# Patient Record
Sex: Male | Born: 1970
Health system: Southern US, Community
[De-identification: ages and names within clinical notes are randomized; demographics above are authoritative.]

## PROBLEM LIST (undated history)

## (undated) DIAGNOSIS — B0089 Other herpesviral infection: Secondary | ICD-10-CM

## (undated) DIAGNOSIS — R7303 Prediabetes: Secondary | ICD-10-CM

## (undated) DIAGNOSIS — L570 Actinic keratosis: Secondary | ICD-10-CM

## (undated) DIAGNOSIS — C44519 Basal cell carcinoma of skin of other part of trunk: Secondary | ICD-10-CM

## (undated) DIAGNOSIS — K219 Gastro-esophageal reflux disease without esophagitis: Secondary | ICD-10-CM

## (undated) DIAGNOSIS — N529 Male erectile dysfunction, unspecified: Secondary | ICD-10-CM

## (undated) DIAGNOSIS — E782 Mixed hyperlipidemia: Secondary | ICD-10-CM

## (undated) DIAGNOSIS — K429 Umbilical hernia without obstruction or gangrene: Secondary | ICD-10-CM

## (undated) DIAGNOSIS — K4090 Unilateral inguinal hernia, without obstruction or gangrene, not specified as recurrent: Secondary | ICD-10-CM

## (undated) DIAGNOSIS — E785 Hyperlipidemia, unspecified: Secondary | ICD-10-CM

## (undated) DIAGNOSIS — L659 Nonscarring hair loss, unspecified: Secondary | ICD-10-CM

## (undated) HISTORY — DX: Mixed hyperlipidemia: E78.2

## (undated) HISTORY — DX: Basal cell carcinoma of skin of other part of trunk: C44.519

## (undated) HISTORY — DX: Actinic keratosis: L57.0

## (undated) HISTORY — DX: Umbilical hernia without obstruction or gangrene: K42.9

## (undated) HISTORY — DX: Other herpesviral infection: B00.89

## (undated) HISTORY — DX: Hyperlipidemia, unspecified: E78.5

---

## 1997-04-29 HISTORY — PX: OTHER SURGICAL HISTORY: SHX169

## 2002-01-24 ENCOUNTER — Encounter: Payer: Self-pay | Admitting: Orthopedic Surgery

## 2002-01-24 ENCOUNTER — Ambulatory Visit (HOSPITAL_COMMUNITY): Admission: RE | Admit: 2002-01-24 | Discharge: 2002-01-24 | Payer: Self-pay | Admitting: Orthopedic Surgery

## 2002-01-31 ENCOUNTER — Encounter: Payer: Self-pay | Admitting: Orthopedic Surgery

## 2002-01-31 ENCOUNTER — Ambulatory Visit (HOSPITAL_COMMUNITY): Admission: RE | Admit: 2002-01-31 | Discharge: 2002-01-31 | Payer: Self-pay | Admitting: Orthopedic Surgery

## 2004-03-15 ENCOUNTER — Encounter: Admission: RE | Admit: 2004-03-15 | Discharge: 2004-03-15 | Payer: Self-pay | Admitting: Orthopedic Surgery

## 2005-04-25 ENCOUNTER — Ambulatory Visit: Payer: Self-pay | Admitting: Family Medicine

## 2005-04-25 LAB — CONVERTED CEMR LAB
Blood Glucose, Fasting: 91 mg/dL
TSH: 2.4 microintl units/mL

## 2005-05-17 ENCOUNTER — Ambulatory Visit: Payer: Self-pay | Admitting: Family Medicine

## 2007-01-20 ENCOUNTER — Emergency Department: Payer: Self-pay | Admitting: Emergency Medicine

## 2007-02-05 ENCOUNTER — Ambulatory Visit: Payer: Self-pay | Admitting: Unknown Physician Specialty

## 2007-02-27 HISTORY — PX: OTHER SURGICAL HISTORY: SHX169

## 2007-03-13 ENCOUNTER — Ambulatory Visit: Payer: Self-pay | Admitting: Orthopaedic Surgery

## 2008-09-30 ENCOUNTER — Encounter: Payer: Self-pay | Admitting: Family Medicine

## 2008-09-30 DIAGNOSIS — E785 Hyperlipidemia, unspecified: Secondary | ICD-10-CM | POA: Insufficient documentation

## 2009-05-28 ENCOUNTER — Ambulatory Visit: Payer: Self-pay | Admitting: Family Medicine

## 2009-05-28 DIAGNOSIS — C4491 Basal cell carcinoma of skin, unspecified: Secondary | ICD-10-CM | POA: Insufficient documentation

## 2009-05-28 DIAGNOSIS — C44599 Other specified malignant neoplasm of skin of other part of trunk: Secondary | ICD-10-CM

## 2009-05-28 DIAGNOSIS — K429 Umbilical hernia without obstruction or gangrene: Secondary | ICD-10-CM | POA: Insufficient documentation

## 2009-06-01 ENCOUNTER — Ambulatory Visit: Payer: Self-pay | Admitting: Family Medicine

## 2009-06-01 LAB — CONVERTED CEMR LAB
ALT: 27 units/L (ref 0–53)
AST: 23 units/L (ref 0–37)
Albumin: 4.6 g/dL (ref 3.5–5.2)
Alkaline Phosphatase: 69 units/L (ref 39–117)
BUN: 15 mg/dL (ref 6–23)
Basophils Absolute: 0 10*3/uL (ref 0.0–0.1)
Basophils Relative: 0.2 % (ref 0.0–3.0)
Bilirubin, Direct: 0 mg/dL (ref 0.0–0.3)
CO2: 30 meq/L (ref 19–32)
Calcium: 9.7 mg/dL (ref 8.4–10.5)
Chloride: 107 meq/L (ref 96–112)
Cholesterol: 215 mg/dL — ABNORMAL HIGH (ref 0–200)
Creatinine, Ser: 0.9 mg/dL (ref 0.4–1.5)
Direct LDL: 148.6 mg/dL
Eosinophils Absolute: 0.1 10*3/uL (ref 0.0–0.7)
Eosinophils Relative: 1.9 % (ref 0.0–5.0)
GFR calc non Af Amer: 101.45 mL/min (ref >60)
Glucose, Bld: 103 mg/dL — ABNORMAL HIGH (ref 70–99)
HCT: 45.9 % (ref 39.0–52.0)
HDL: 27.4 mg/dL — ABNORMAL LOW (ref >39.00)
Hemoglobin: 16 g/dL (ref 13.0–17.0)
Lymphocytes Relative: 29.8 % (ref 12.0–46.0)
Lymphs Abs: 2.1 10*3/uL (ref 0.7–4.0)
MCHC: 34.9 g/dL (ref 30.0–36.0)
MCV: 88.3 fL (ref 78.0–100.0)
Monocytes Absolute: 0.5 10*3/uL (ref 0.1–1.0)
Monocytes Relative: 6.7 % (ref 3.0–12.0)
Neutro Abs: 4.2 10*3/uL (ref 1.4–7.7)
Neutrophils Relative %: 61.4 % (ref 43.0–77.0)
Platelets: 215 10*3/uL (ref 150.0–400.0)
Potassium: 4.7 meq/L (ref 3.5–5.1)
RBC: 5.2 M/uL (ref 4.22–5.81)
RDW: 12.6 % (ref 11.5–14.6)
Sodium: 141 meq/L (ref 135–145)
TSH: 2.47 microintl units/mL (ref 0.35–5.50)
Total Bilirubin: 0.8 mg/dL (ref 0.3–1.2)
Total CHOL/HDL Ratio: 8
Total Protein: 8.1 g/dL (ref 6.0–8.3)
Triglycerides: 251 mg/dL — ABNORMAL HIGH (ref 0.0–149.0)
VLDL: 50.2 mg/dL — ABNORMAL HIGH (ref 0.0–40.0)
WBC: 6.9 10*3/uL (ref 4.5–10.5)

## 2009-06-04 ENCOUNTER — Ambulatory Visit: Payer: Self-pay | Admitting: Family Medicine

## 2009-06-15 ENCOUNTER — Ambulatory Visit: Payer: Self-pay | Admitting: Family Medicine

## 2009-07-28 ENCOUNTER — Ambulatory Visit: Payer: Self-pay | Admitting: Family Medicine

## 2009-08-10 ENCOUNTER — Ambulatory Visit: Payer: Self-pay | Admitting: Family Medicine

## 2010-01-19 ENCOUNTER — Telehealth: Payer: Self-pay | Admitting: Family Medicine

## 2010-01-20 ENCOUNTER — Ambulatory Visit: Payer: Self-pay | Admitting: Family Medicine

## 2010-04-05 ENCOUNTER — Encounter (INDEPENDENT_AMBULATORY_CARE_PROVIDER_SITE_OTHER): Payer: Self-pay | Admitting: *Deleted

## 2010-09-28 NOTE — Progress Notes (Signed)
Summary: Need pain meds called  in  Phone Note Call from Patient Call back at [phone]   Caller: Patient Call For: Philip Leeks MD Summary of Call: Pt called, says he was treated for left hand and arm pain maybe 3 mths ago.  Pt is experiencing more pain. Would like more pain meds called into [facility] [address], [place]  Pt call back # [phone].Marland KitchenDaine Gip  Jan 19, 2010 2:17 PM  Initial call taken by: Daine Gip,  Jan 19, 2010 2:17 PM  Follow-up for Phone Call        I don't know who treated him, but I don't think it was here. He will need to be seen if we are talking about narcotic meds. Philip Leeks MD  Jan 19, 2010 3:04 PM   Left message on machine for patient to call back. Sydell Axon LPN  Jan 19, 2010 3:14 PM    Additional Follow-up for Phone Call Additional follow up Details #1::        Patient notified as instructed by telephone. Patient stated that he was given Vicodin about a year ago. Patient to schedule an appointment to be seen. Additional Follow-up by: Sydell Axon LPN,  Jan 19, 2010 3:25 PM

## 2010-09-28 NOTE — Assessment & Plan Note (Signed)
Summary: L HAND PAIN/CLE   Vital Signs:  Patient profile:   40 year old male Weight:      202 pounds Temp:     97.8 degrees F oral Pulse rate:   84 / minute Pulse rhythm:   regular BP sitting:   118 / 80  (left arm) Cuff size:   regular  Vitals Entered By: Sydell Axon LPN (Jan 20, 2010 9:15 AM) CC: Left arm and hand pain, check back where lesion was removed   History of Present Illness: Pt here for left forarm discomfort that typically also involves the ulnar side three fingers and occas up into the distal upper arm on the ulnar side. He can occas see redness and slight swelling. He has had this recurrently for many years and typically lasts two weeks and then goes away. He has just hadd more intense pain this tiome and was out iof Vicodin that I had given him in Dec.  Problems Prior to Update: 1)  Health Maintenance Exam  (ICD-V70.0) 2)  Umbilical Hernia  (ICD-553.1) 3)  Sprain and Strain of [place]  (ICD-844.1) 4)  Carcinoma, Basal Cell, Back  (ICD-173.5) 5)  Open Wound L Ft No Toe Alone w/o Mention Comp  (ICD-892.0) 6)  Hyperlipidemia, With Low Hdl  (ICD-272.4) 7)  Herpetic Whitlow, Left Ring Finger  (ICD-054.6)  Medications Prior to Update: 1)  Multivitamins  Tabs (Multiple Vitamin) .Marland Kitchen.. 1 Daily  Allergies: No Known Drug Allergies  Physical Exam  General:  Well-developed,well-nourished,in no acute distress; alert,appropriate and cooperative throughout examination. Head:  Normocephalic and atraumatic without obvious abnormalities. No apparent alopecia or balding. Sinuses NT. Eyes:  Conjunctiva clear bilaterally.  Ears:  External ear exam shows no significant lesions or deformities.  Otoscopic examination reveals clear canals, tympanic membranes are intact bilaterally without bulging, retraction, inflammation or discharge. Hearing is grossly normal bilaterally. Nose:  External nasal examination shows no deformity or inflammation. Nasal mucosa are pink and moist  without lesions or exudates. Mouth:  Oral mucosa and oropharynx without lesions or exudates.  Teeth in good repair. Extremities:  Left arm, slightly tender at the med epicondyle, no carpal tunnel sxs, slight erythema along the ulnar distribution.  Skin:  Keloid stable at excision site of upper right back.   Impression & Recommendations:  Problem # 1:  PAIN IN SOFT TISSUES OF L ARM, ULNAR DISTR (ICD-729.5) Assessment Deteriorated Start Aleve regularly per instructions. Heat and ice. Vicodin as needed sparinglty. Recheck in 2 weeks.  Problem # 2:  CARCINOMA, BASAL CELL, BACK (ICD-173.5) Assessment: Improved Has healed with Keloid but looks fine. Reassuered.  Complete Medication List: 1)  Multivitamins Tabs (Multiple vitamin) .Marland Kitchen.. 1 daily 2)  Vicodin 5-500 Mg Tabs (Hydrocodone-acetaminophen) .... One tab by mouth at night as needed arm pain.   Patient Instructions: 1)  Take 2 Aleve after brfst, 3 after supper. 2)  Use heat and ice as dir. 3)  Take Vicodin as needed. Prescriptions: VICODIN 5-500 MG TABS (HYDROCODONE-ACETAMINOPHEN) one tab by mouth at night as needed arm pain.  #30 x 0   Entered and Authorized by:   Shaune Leeks MD   Signed by:   Shaune Leeks MD on 01/20/2010   Method used:   Print then Give to Patient   RxID:   [id number]   Current Allergies (reviewed today): No known allergies

## 2010-09-28 NOTE — Letter (Signed)
Summary: Nadara Eaton letter  [place] [facility]  [address] [place], [place] [address]   Phone: [phone]  Fax: [phone]       04/05/2010 MRN: [id number]  [facility] [address] [place], [place]  [address]  Dear Mr. Lyndal [facility] [place], and [place] announce the retirement of Arta Silence, M.D., from full-time practice at the [facility] office effective February 25, 2010 and his plans of returning part-time.  It is important to Dr. Hetty Ely and to our practice that you understand that [facility] has seven physicians in our office for your health care needs.  We will continue to offer the same exceptional care that you have today.    Dr. Hetty Ely has spoken to many of you about his plans for retirement and returning part-time in the fall.   We will continue to work with you through the transition to schedule appointments for you in the office and meet the high standards that [facility] is committed to.   Again, it is with great pleasure that we share the news that Dr. Hetty Ely will return to [facility] at [facility] in October of 2011 with a reduced schedule.    If you have any questions, or would like to request an appointment with one of our physicians, please call us at [phone] and press the option for Scheduling an appointment.  We take pleasure in providing you with excellent patient care and look forward to seeing you at your next office visit.  Our [facility] Phys[facility] are:  Tillman Abide, M.D. Laurita Quint, M.D. Roxy Manns, M.D. Kerby Nora, M.D. Hannah Beat, M.D. Ruthe Mannan, M.D. We proudly welcomed Raechel Ache, M.D. and Eustaquio Boyden, M.D. to the practice in July/August 2011.  Sincerely,   [facility]

## 2011-05-18 ENCOUNTER — Ambulatory Visit (INDEPENDENT_AMBULATORY_CARE_PROVIDER_SITE_OTHER): Payer: BC Managed Care – PPO | Admitting: Family Medicine

## 2011-05-18 ENCOUNTER — Encounter: Payer: Self-pay | Admitting: Family Medicine

## 2011-05-18 DIAGNOSIS — B029 Zoster without complications: Secondary | ICD-10-CM

## 2011-05-18 DIAGNOSIS — K429 Umbilical hernia without obstruction or gangrene: Secondary | ICD-10-CM

## 2011-05-18 DIAGNOSIS — D1730 Benign lipomatous neoplasm of skin and subcutaneous tissue of unspecified sites: Secondary | ICD-10-CM | POA: Insufficient documentation

## 2011-05-18 DIAGNOSIS — D1739 Benign lipomatous neoplasm of skin and subcutaneous tissue of other sites: Secondary | ICD-10-CM

## 2011-05-18 MED ORDER — HYDROCODONE-ACETAMINOPHEN 5-500 MG PO TABS: 1.0000 | ORAL_TABLET | Freq: Every evening | ORAL | Status: DC | PRN

## 2011-05-18 NOTE — Assessment & Plan Note (Signed)
His lesion between his fingers with same place and same look of outbreak and involvement up the hand and arm with erythema but not overt further vesicular rash, acts like shingles but also like overt herpetic outbreak. Valtrex has not been worthwhile as he tolerates his occas outbreaks with pain medication well and does not want to take chronic medication. Will therefore treat as desired with pain medication when needed. He takes this basically once a day to allow to get to sleep so have no problem prescribing.  Discussed eventually getting Zostavax, which may help lessen frequency, length of intensity of outbreak.

## 2011-05-18 NOTE — Assessment & Plan Note (Signed)
No change by my exam. Cont to follow.

## 2011-05-18 NOTE — Progress Notes (Signed)
  Subjective:    Patient ID: Philip Reynolds, male    DOB: 1971-07-04, 40 y.o.   MRN: [id number]  HPI Pt here as acute appt after not being seen for some time. He gets a vessicular outbreak in coalescent area between the left ring finger medial side mid medial phalanx. He had been on Valtrex daily in the past which helped but he only has symptoms once or twice a year so he stopped the Valtrex as it did not seem justified based on frequency of sxs. He was told once in the past that he had herpetic whitlow. His sxs do seem to be triggered by stress.   He also has slight swollen subcutaneous lesion on the right lower abd. He also again questions whether he needs to be seen for his umbilical hernia. It has not progressed over the years and is still contained within the umbilicus proper.    Review of SystemsNoncontributory except as above.       Objective:   Physical Exam  Constitutional: He appears well-developed and well-nourished. No distress.  HENT:  Head: Normocephalic and atraumatic.  Right Ear: External ear normal.  Left Ear: External ear normal.  Nose: Nose normal.  Mouth/Throat: Oropharynx is clear and moist.  Eyes: Conjunctivae and EOM are normal. Pupils are equal, round, and reactive to light. Right eye exhibits no discharge. Left eye exhibits no discharge.  Neck: Normal range of motion. Neck supple.  Cardiovascular: Normal rate and regular rhythm.   Pulmonary/Chest: Effort normal and breath sounds normal. He has no wheezes.  Abdominal: Soft. Bowel sounds are normal. He exhibits no distension. There is no tenderness. There is no rebound.       Small umbilical hernia totally contained within his umbilicus. No overt defect felt. No tenderness or distention and unchanged from previous exams.  Lymphadenopathy:    He has no cervical adenopathy.  Skin: Skin is warm and dry. No rash noted. He is not diaphoretic. No erythema. No pallor.       1.5cm soft mobile subdermal swelling in the  RLQ of the abdomen, NT and unchanged from previously.          Assessment & Plan:

## 2011-05-18 NOTE — Assessment & Plan Note (Signed)
1.5cm on lower right abdomen. Smaller one on back. Will probably develop more. Reassured and will follow.

## 2013-07-14 ENCOUNTER — Other Ambulatory Visit: Payer: Self-pay | Admitting: Family Medicine

## 2013-07-14 DIAGNOSIS — E785 Hyperlipidemia, unspecified: Secondary | ICD-10-CM

## 2013-07-15 ENCOUNTER — Other Ambulatory Visit: Payer: BC Managed Care – PPO

## 2013-07-16 ENCOUNTER — Other Ambulatory Visit (INDEPENDENT_AMBULATORY_CARE_PROVIDER_SITE_OTHER): Payer: BC Managed Care – PPO

## 2013-07-16 DIAGNOSIS — E785 Hyperlipidemia, unspecified: Secondary | ICD-10-CM

## 2013-07-16 LAB — BASIC METABOLIC PANEL
BUN: 16 mg/dL (ref 6–23)
Calcium: 9.8 mg/dL (ref 8.4–10.5)
Creatinine, Ser: 1.1 mg/dL (ref 0.4–1.5)
GFR: 83.07 mL/min (ref >60.00)
Glucose, Bld: 101 mg/dL — ABNORMAL HIGH (ref 70–99)

## 2013-07-18 ENCOUNTER — Encounter: Payer: Self-pay | Admitting: Family Medicine

## 2013-07-18 ENCOUNTER — Encounter: Payer: Self-pay | Admitting: *Deleted

## 2013-07-18 ENCOUNTER — Ambulatory Visit (INDEPENDENT_AMBULATORY_CARE_PROVIDER_SITE_OTHER): Payer: BC Managed Care – PPO | Admitting: Family Medicine

## 2013-07-18 VITALS — BP 122/86 | HR 64 | Temp 98.0°F | Ht 72.0 in | Wt 199.0 lb

## 2013-07-18 DIAGNOSIS — B029 Zoster without complications: Secondary | ICD-10-CM

## 2013-07-18 DIAGNOSIS — Z23 Encounter for immunization: Secondary | ICD-10-CM

## 2013-07-18 DIAGNOSIS — Z0000 Encounter for general adult medical examination without abnormal findings: Secondary | ICD-10-CM | POA: Insufficient documentation

## 2013-07-18 DIAGNOSIS — K4090 Unilateral inguinal hernia, without obstruction or gangrene, not specified as recurrent: Secondary | ICD-10-CM | POA: Insufficient documentation

## 2013-07-18 DIAGNOSIS — E785 Hyperlipidemia, unspecified: Secondary | ICD-10-CM

## 2013-07-18 DIAGNOSIS — Z0001 Encounter for general adult medical examination with abnormal findings: Secondary | ICD-10-CM | POA: Insufficient documentation

## 2013-07-18 DIAGNOSIS — K429 Umbilical hernia without obstruction or gangrene: Secondary | ICD-10-CM

## 2013-07-18 NOTE — Assessment & Plan Note (Signed)
Elevated again today - reviewed with patient as well as discussed healthy diet changes to help control hyperlipidemia. Provided with low chol diet handout.

## 2013-07-18 NOTE — Progress Notes (Signed)
Subjective:    Patient ID: Philip Reynolds, male    DOB: Jan 25, 1971, 42 y.o.   MRN: [id number]  HPI CC: CPE  Pleasant 42 yo presents today for annual exam.  ?R inguinal hernia.  Noticed worse for last 2 week (started after prolonged period of walking at [employer]).  Worse with prolonged standing/walking - bulges.  Reduces with laying down.  H/o vicodin use for intermittent ?shingles vs herpetic whitlow of left hand.  Has tried valtrex in the past - didn't notice improvement.  Stress or injury related.  Lives with wife Philip Reynolds Occ: [employer] business. Activity: regular gym 5d /wk Diet: tries to eat healthy, avoids fast food, good water, fruits/vegetables daily  Wt Readings from Last 3 Encounters:  07/18/13 199 lb (90.266 kg)  05/18/11 198 lb (89.812 kg)  01/20/10 202 lb (91.627 kg)   Preventative: Flu - today Td 2002.  Tdap today  Medications and allergies reviewed and updated in chart.  Past histories reviewed and updated if relevant as below. Patient Active Problem List   Diagnosis Date Noted  . Shingles outbreak 05/18/2011  . Lipoma of skin and subcutaneous tissue 05/18/2011  . CARCINOMA, BASAL CELL, BACK 05/28/2009  . UMBILICAL HERNIA 05/28/2009  . HYPERLIPIDEMIA, WITH LOW HDL 09/30/2008   Past Medical History  Diagnosis Date  . Basal cell carcinoma of back   . Herpetic whitlow     left ring finger; Dr. Levonne Lapping  . Other and unspecified hyperlipidemia   . Umbilical hernia without mention of obstruction or gangrene    Past Surgical History  Procedure Laterality Date  . Herpetic whitlow  04/1997    Left hand [facility] by Dr. Henderson Newcomer at [facility]  . Anterior cruciate ligament repair (aka acl) and meniscectomy  02/2007    Right knee (Dr. Mack Guise)   History  Substance Use Topics  . Smoking status: Never Smoker   . Smokeless tobacco: Never Used  . Alcohol Use: Yes     Comment: Occasional   Family History  Problem Relation Age of Onset  .  Diabetes Father   . Stroke Father     Post radiation for brain tumor  . Cancer Father     Brain tumor  . Depression Father   . Heart disease Neg Hx   . Kidney disease Neg Hx   . Alcohol abuse Neg Hx   . Drug abuse Neg Hx    No Known Allergies Current Outpatient Prescriptions on File Prior to Visit  Medication Sig Dispense Refill  . Multiple Vitamin (MULTIVITAMIN) tablet Take 1 tablet by mouth daily.         No current facility-administered medications on file prior to visit.    Review of Systems  Constitutional: Negative for fever, chills, activity change, appetite change, fatigue and unexpected weight change.  HENT: Negative for hearing loss.   Eyes: Negative for visual disturbance.  Respiratory: Negative for cough, chest tightness, shortness of breath and wheezing.   Cardiovascular: Negative for chest pain, palpitations and leg swelling.  Gastrointestinal: Negative for nausea, vomiting, abdominal pain, diarrhea, constipation, blood in stool and abdominal distention.  Genitourinary: Negative for hematuria and difficulty urinating.  Musculoskeletal: Negative for arthralgias, myalgias and neck pain.  Skin: Negative for rash.  Neurological: Negative for dizziness, seizures, syncope and headaches.  Hematological: Negative for adenopathy. Does not bruise/bleed easily.  Psychiatric/Behavioral: Negative for dysphoric mood. The patient is not nervous/anxious.        Objective:   Physical Exam  Nursing note  and vitals reviewed. Constitutional: He is oriented to person, place, and time. He appears well-developed and well-nourished. No distress.  HENT:  Head: Normocephalic and atraumatic.  Right Ear: Hearing, tympanic membrane, external ear and ear canal normal.  Left Ear: Hearing, tympanic membrane, external ear and ear canal normal.  Nose: Nose normal.  Mouth/Throat: Oropharynx is clear and moist. No oropharyngeal exudate.  Eyes: Conjunctivae and EOM are normal. Pupils are equal,  round, and reactive to light. No scleral icterus.  Neck: Normal range of motion. Neck supple.  Cardiovascular: Normal rate, regular rhythm, normal heart sounds and intact distal pulses.   No murmur heard. Pulses:      Radial pulses are 2+ on the right side, and 2+ on the left side.  Pulmonary/Chest: Effort normal and breath sounds normal. No respiratory distress. He has no wheezes. He has no rales.  Abdominal: Soft. Bowel sounds are normal. He exhibits no distension and no mass. There is no tenderness. There is no rebound and no guarding. Hernia confirmed negative in the right inguinal area and confirmed negative in the left inguinal area.  Genitourinary:  No hernia appreciated today.  Musculoskeletal: Normal range of motion. He exhibits no edema.  Lymphadenopathy:    He has no cervical adenopathy.       Right: No inguinal adenopathy present.       Left: No inguinal adenopathy present.  Neurological: He is alert and oriented to person, place, and time.  CN grossly intact, station and gait intact  Skin: Skin is warm and dry. No rash noted.  Psychiatric: He has a normal mood and affect. His behavior is normal. Judgment and thought content normal.      Assessment & Plan:

## 2013-07-18 NOTE — Assessment & Plan Note (Signed)
minimal

## 2013-07-18 NOTE — Patient Instructions (Signed)
Flu shot today. Tdap today (tetanus and pertussis). Low chol handout provided today. Pass by [facility]'s office for referral to surgeon to check R hernia. Good to see you today, call us with questions.

## 2013-07-18 NOTE — Assessment & Plan Note (Signed)
Advised return when next flare for eval, but I would prescribe pain med in interim as needed.

## 2013-07-18 NOTE — Assessment & Plan Note (Signed)
Story consistent with R inguinal hernia although not appreciated on exam today (he has been sedentary last 2 days) Will refer to surgery for eval (pt requests Dr. Birdie Sons).

## 2013-07-18 NOTE — Progress Notes (Signed)
Pre-visit discussion using our clinic review tool. No additional management support is needed unless otherwise documented below in the visit note.  

## 2013-07-18 NOTE — Addendum Note (Signed)
Addended by: Josph Macho A on: 07/18/2013 12:43 PM   Modules accepted: Orders

## 2013-07-18 NOTE — Assessment & Plan Note (Signed)
Preventative protocols reviewed and updated unless pt declined. Discussed healthy diet and lifestyle.  

## 2013-07-30 ENCOUNTER — Encounter: Payer: Self-pay | Admitting: General Surgery

## 2013-07-30 ENCOUNTER — Ambulatory Visit (INDEPENDENT_AMBULATORY_CARE_PROVIDER_SITE_OTHER): Payer: BC Managed Care – PPO | Admitting: General Surgery

## 2013-07-30 VITALS — BP 122/80 | HR 76 | Resp 14 | Ht 72.0 in | Wt 201.0 lb

## 2013-07-30 DIAGNOSIS — K4090 Unilateral inguinal hernia, without obstruction or gangrene, not specified as recurrent: Secondary | ICD-10-CM

## 2013-07-30 DIAGNOSIS — K429 Umbilical hernia without obstruction or gangrene: Secondary | ICD-10-CM

## 2013-07-30 NOTE — Patient Instructions (Addendum)
Patient to be scheduled for hernia repair.   Hernia, Surgical Repair A hernia occurs when an internal organ pushes out through a weak spot in the belly (abdominal) wall muscles. Hernias commonly occur in the groin and around the navel. Hernias often can be pushed back into place (reduced). Most hernias tend to get worse over time. Problems occur when abdominal contents get stuck in the opening (incarcerated hernia). The blood supply gets cut off (strangulated hernia). This is an emergency and needs surgery. Otherwise, hernia repair can be an elective procedure. This means you can schedule this at your convenience when an emergency is not present. Because complications can occur, if you decide to repair the hernia, it is best to do it soon. When it becomes an emergency procedure, there is increased risk of complications after surgery. CAUSES   Heavy lifting.  Obesity.  Prolonged coughing.  Straining to move your bowels.  Hernias can also occur through a cut (incision) by a surgeonafter an abdominal operation. HOME CARE INSTRUCTIONS Before the repair:  Bed rest is not required. You may continue your normal activities, but avoid heavy lifting (more than 10 pounds) or straining. Cough gently. If you are a smoker, it is best to stop. Even the best hernia repair can break down with the continual strain of coughing.  Do not wear anything tight over your hernia. Do not try to keep it in with an outside bandage or truss. These can damage abdominal contents if they are trapped in the hernia sac.  Eat a normal diet. Avoid constipation. Straining over long periods of time to have a bowel movement will increase hernia size. It also can breakdown repairs. If you cannot do this with diet alone, laxatives or stool softeners may be used. PRIOR TO SURGERY, SEEK IMMEDIATE MEDICAL CARE IF: You have problems (symptoms) of a trapped (incarcerated) hernia. Symptoms include:  An oral temperature above 102 F  (38.9 C) develops, or as your caregiver suggests.  Increasing abdominal pain.  Feeling sick to your stomach(nausea) and vomiting.  You stop passing gas or stool.  The hernia is stuck outside the abdomen, looks discolored, feels hard, or is tender.  You have any changes in your bowel habits or in the hernia that is unusual for you. LET YOUR CAREGIVERS KNOW ABOUT THE FOLLOWING:  Allergies.  Medications taken including herbs, eye drops, over the counter medications, and creams.  Use of steroids (by mouth or creams).  Family or personal history of problems with anesthetics or Novocaine.  Possibility of pregnancy, if this applies.  Personal history of blood clots (thrombophlebitis).  Family or personal history of bleeding or blood problems.  Previous surgery.  Other health problems. BEFORE THE PROCEDURE You should be present 1 hour prior to your procedure, or as directed by your caregiver.  AFTER THE PROCEDURE After surgery, you will be taken to the recovery area. A nurse will watch and check your progress there. Once you are awake, stable, and taking fluids well, you will be allowed to go home as long as there are no problems. Once home, an ice pack (wrapped in a light towel) applied to your operative site may help with discomfort. It may also keep the swelling down. Do not lift anything heavier than 10 pounds (4.55 kilograms). Take showers not baths. Do not drive while taking narcotics. Follow instructions as suggested by your caregiver.  SEEK IMMEDIATE MEDICAL CARE IF: After surgery:  There is redness, swelling, or increasing pain in the wound.  There  is pus coming from the wound.  There is drainage from a wound lasting longer than 1 day.  An unexplained oral temperature above 102 F (38.9 C) develops.  You notice a foul smell coming from the wound or dressing.  There is a breaking open of a wound (edged not staying together) after the sutures have been removed.  You  notice increasing pain in the shoulders (shoulder strap areas).  You develop dizzy episodes or fainting while standing.  You develop persistent nausea or vomiting.  You develop a rash.  You have difficulty breathing.  You develop any reaction or side effects to medications given. MAKE SURE YOU:   Understand these instructions.  Will watch your condition.  Will get help right away if you are not doing well or get worse. Document Released: 02/08/2001 Document Revised: 11/07/2011 Document Reviewed: 01/01/2008 [facility] Patient Information 2014 [place], [place].  Patient's surgery has been scheduled for 08-06-13 at [facility].

## 2013-07-30 NOTE — Progress Notes (Signed)
Patient ID: Philip Reynolds, male   DOB: April 18, 1971, 42 y.o.   MRN: [id number]  Chief Complaint  Patient presents with  . Other    New patient evaluation of right inguinal hernia    HPI Philip Reynolds is a 42 y.o. male.  Patient here today for an evaluation of a hernia in the right groin.  States that he noticed it for about 1  month.  It does seem to be causing some  pain that comes and goes. He notices it more when he's on his feet a lot. He is able to push the hernia back in. No nausea, vomiting, constipation or diarrhea noted.  HPI  Past Medical History  Diagnosis Date  . Basal cell carcinoma of back   . Herpetic whitlow     left ring finger; Dr. Levonne Lapping  . Other and unspecified hyperlipidemia   . Umbilical hernia without mention of obstruction or gangrene     Past Surgical History  Procedure Laterality Date  . Herpetic whitlow  04/1997    Left hand followed by Dr. Henderson Newcomer at [facility]  . Anterior cruciate ligament repair (aka acl) and meniscectomy  02/2007    Right knee (Dr. Mack Guise)    Family History  Problem Relation Age of Onset  . Diabetes Father   . Stroke Father     Post radiation for brain tumor  . Cancer Father     Brain tumor  . Depression Father   . Kidney disease Neg Hx   . Alcohol abuse Neg Hx   . Drug abuse Neg Hx   . CAD Neg Hx     Social History History  Substance Use Topics  . Smoking status: Never Smoker   . Smokeless tobacco: Never Used  . Alcohol Use: Yes     Comment: Occasional    No Known Allergies  Current Outpatient Prescriptions  Medication Sig Dispense Refill  . Multiple Vitamin (MULTIVITAMIN) tablet Take 1 tablet by mouth daily.         No current facility-administered medications for this visit.    Review of Systems Review of Systems  Constitutional: Negative.   Respiratory: Negative.   Cardiovascular: Negative.   Gastrointestinal: Positive for abdominal pain.    Blood pressure 122/80, pulse 76, resp. rate 14, height  6' (1.829 m), weight 201 lb (91.173 kg).  Physical Exam Physical Exam  Constitutional: He is oriented to person, place, and time. He appears well-developed and well-nourished.  Eyes: Conjunctivae are normal. No scleral icterus.  Neck: Neck supple. No thyromegaly present.  Cardiovascular: Normal rate, regular rhythm and normal heart sounds.   No murmur heard. Pulmonary/Chest: Effort normal and breath sounds normal.  Abdominal: Soft. Normal appearance and bowel sounds are normal. A hernia is present. Hernia confirmed positive in the right inguinal area (small reducible, mildly tender).  Small reducible umbilical hernia. 2 cm in size. Fascial defect less than 1 cm.   Lymphadenopathy:    He has no cervical adenopathy.  Neurological: He is alert and oriented to person, place, and time.  Skin: Skin is warm and dry.    Data Reviewed None  Assessment    Umbilical and Right Inguinal Hernia present. He has had the umbilical hernia for a long time, has not changed and is not symptomatic.    Plan   Discussed repair of inguinal harnia. Both laparoscopic and open repairs explained. At present he opts to have open repair. Also he is agreeable to MAC. Patient to be scheduled  for right inguinal hernia repair.     This patient's surgery has been scheduled for 08-06-13 at [facility].   Alazne Quant G 07/30/2013, 4:20 PM

## 2013-07-31 ENCOUNTER — Telehealth: Payer: Self-pay | Admitting: *Deleted

## 2013-07-31 NOTE — Telephone Encounter (Signed)
Patient called to report that he would like to have a lap repair of his right inguinal hernia and umbilical hernia at the same time. Please update orders. Patient is currently scheduled for surgery on 08-06-13 at [facility].   [place] has been notified of this change.

## 2013-08-01 NOTE — Addendum Note (Signed)
Addended by: Kieth Brightly on: 08/01/2013 09:56 AM   Modules accepted: Orders

## 2013-08-06 ENCOUNTER — Ambulatory Visit: Payer: Self-pay | Admitting: General Surgery

## 2013-08-06 DIAGNOSIS — K4090 Unilateral inguinal hernia, without obstruction or gangrene, not specified as recurrent: Secondary | ICD-10-CM

## 2013-08-06 DIAGNOSIS — K429 Umbilical hernia without obstruction or gangrene: Secondary | ICD-10-CM

## 2013-08-06 HISTORY — PX: UMBILICAL HERNIA REPAIR: SHX196

## 2013-08-06 HISTORY — PX: HERNIA REPAIR: SHX51

## 2013-08-06 HISTORY — PX: INGUINAL HERNIA REPAIR: SUR1180

## 2013-08-07 ENCOUNTER — Encounter: Payer: Self-pay | Admitting: General Surgery

## 2013-08-19 ENCOUNTER — Encounter: Payer: Self-pay | Admitting: General Surgery

## 2013-08-19 ENCOUNTER — Ambulatory Visit (INDEPENDENT_AMBULATORY_CARE_PROVIDER_SITE_OTHER): Payer: BC Managed Care – PPO | Admitting: General Surgery

## 2013-08-19 VITALS — BP 120/80 | HR 76 | Resp 12 | Ht 72.0 in | Wt 199.0 lb

## 2013-08-19 DIAGNOSIS — K429 Umbilical hernia without obstruction or gangrene: Secondary | ICD-10-CM

## 2013-08-19 DIAGNOSIS — K4090 Unilateral inguinal hernia, without obstruction or gangrene, not specified as recurrent: Secondary | ICD-10-CM

## 2013-08-19 NOTE — Progress Notes (Signed)
This is a 42 year old malel here today for his post op umbilical hernia repair and right inguinal hernia repair done on 08/06/13. Patient states he is doing well.  No recurrent hernia,no sign of infection. Abdomen is soft.incision looks clean and healing well.

## 2013-08-19 NOTE — Patient Instructions (Addendum)
Patient to return in one month. After another 5 days he start increasing activity as tolerated.

## 2013-08-20 ENCOUNTER — Encounter: Payer: Self-pay | Admitting: General Surgery

## 2013-09-24 ENCOUNTER — Ambulatory Visit (INDEPENDENT_AMBULATORY_CARE_PROVIDER_SITE_OTHER): Payer: No Typology Code available for payment source | Admitting: General Surgery

## 2013-09-24 ENCOUNTER — Encounter: Payer: Self-pay | Admitting: General Surgery

## 2013-09-24 VITALS — BP 128/78 | HR 78 | Resp 12 | Ht 72.0 in | Wt 201.0 lb

## 2013-09-24 DIAGNOSIS — K429 Umbilical hernia without obstruction or gangrene: Secondary | ICD-10-CM

## 2013-09-24 DIAGNOSIS — K4090 Unilateral inguinal hernia, without obstruction or gangrene, not specified as recurrent: Secondary | ICD-10-CM

## 2013-09-24 NOTE — Progress Notes (Signed)
Patient ID: Philip Reynolds, male   DOB: [birth year], 43 y.o.   MRN: [id number]   The patient presents for a 1 month post op lap/right inguinal hernia as well as an open umbilical hernia repair. The procedure was performed on 08/06/13. The patient states no complaints at this time.   Well healed port sites. No defects noted. Abd is soft, nontender.

## 2013-09-24 NOTE — Patient Instructions (Signed)
Patient to return as needed. 

## 2013-12-16 ENCOUNTER — Ambulatory Visit (INDEPENDENT_AMBULATORY_CARE_PROVIDER_SITE_OTHER): Payer: No Typology Code available for payment source | Admitting: Internal Medicine

## 2013-12-16 ENCOUNTER — Encounter: Payer: Self-pay | Admitting: Internal Medicine

## 2013-12-16 VITALS — BP 122/76 | HR 70 | Temp 98.2°F | Wt 200.0 lb

## 2013-12-16 DIAGNOSIS — B029 Zoster without complications: Secondary | ICD-10-CM

## 2013-12-16 MED ORDER — HYDROCODONE-ACETAMINOPHEN 5-325 MG PO TABS: 1.0000 | ORAL_TABLET | Freq: Four times a day (QID) | ORAL | Status: DC | PRN

## 2013-12-16 NOTE — Patient Instructions (Addendum)
Shingles Shingles (herpes zoster) is an infection that is caused by the same virus that causes chickenpox (varicella). The infection causes a painful skin rash and fluid-filled blisters, which eventually break open, crust over, and heal. It may occur in any area of the body, but it usually affects only one side of the body or face. The pain of shingles usually lasts about 1 month. However, some people with shingles may develop long-term (chronic) pain in the affected area of the body. Shingles often occurs many years after the person had chickenpox. It is more common:  In people older than 50 years.  In people with weakened immune systems, such as those with HIV, AIDS, or cancer.  In people taking medicines that weaken the immune system, such as transplant medicines.  In people under great stress. CAUSES  Shingles is caused by the varicella zoster virus (VZV), which also causes chickenpox. After a person is infected with the virus, it can remain in the person's body for years in an inactive state (dormant). To cause shingles, the virus reactivates and breaks out as an infection in a nerve root. The virus can be spread from person to person (contagious) through contact with open blisters of the shingles rash. It will only spread to people who have not had chickenpox. When these people are exposed to the virus, they may develop chickenpox. They will not develop shingles. Once the blisters scab over, the person is no longer contagious and cannot spread the virus to others. SYMPTOMS  Shingles shows up in stages. The initial symptoms may be pain, itching, and tingling in an area of the skin. This pain is usually described as burning, stabbing, or throbbing.In a few days or weeks, a painful red rash will appear in the area where the pain, itching, and tingling were felt. The rash is usually on one side of the body in a band or belt-like pattern. Then, the rash usually turns into fluid-filled blisters. They  will scab over and dry up in approximately 2 3 weeks. Flu-like symptoms may also occur with the initial symptoms, the rash, or the blisters. These may include:  Fever.  Chills.  Headache.  Upset stomach. DIAGNOSIS  Your caregiver will perform a skin exam to diagnose shingles. Skin scrapings or fluid samples may also be taken from the blisters. This sample will be examined under a microscope or sent to a lab for further testing. TREATMENT  There is no specific cure for shingles. Your caregiver will likely prescribe medicines to help you manage the pain, recover faster, and avoid long-term problems. This may include antiviral drugs, anti-inflammatory drugs, and pain medicines. HOME CARE INSTRUCTIONS   Take a cool bath or apply cool compresses to the area of the rash or blisters as directed. This may help with the pain and itching.   Only take over-the-counter or prescription medicines as directed by your caregiver.   Rest as directed by your caregiver.  Keep your rash and blisters clean with mild soap and cool water or as directed by your caregiver.  Do not pick your blisters or scratch your rash. Apply an anti-itch cream or numbing creams to the affected area as directed by your caregiver.  Keep your shingles rash covered with a loose bandage (dressing).  Avoid skin contact with:  Babies.   Pregnant women.   Children with eczema.   Elderly people with transplants.   People with chronic illnesses, such as leukemia or AIDS.   Wear loose-fitting clothing to help ease   the pain of material rubbing against the rash.  Keep all follow-up appointments with your caregiver.If the area involved is on your face, you may receive a referral for follow-up to a specialist, such as an eye doctor (ophthalmologist) or an ear, nose, and throat (ENT) doctor. Keeping all follow-up appointments will help you avoid eye complications, chronic pain, or disability.  SEEK IMMEDIATE MEDICAL  CARE IF:   You have facial pain, pain around the eye area, or loss of feeling on one side of your face.  You have ear pain or ringing in your ear.  You have loss of taste.  Your pain is not relieved with prescribed medicines.   Your redness or swelling spreads.   You have more pain and swelling.  Your condition is worsening or has changed.   You have a feveror persistent symptoms for more than 2 3 days.  You have a fever and your symptoms suddenly get worse. MAKE SURE YOU:  Understand these instructions.  Will watch your condition.  Will get help right away if you are not doing well or get worse. Document Released: 08/15/2005 Document Revised: 05/09/2012 Document Reviewed: 03/29/2012 Exit[employer] Patient Information 2014 [employer].  

## 2013-12-16 NOTE — Progress Notes (Signed)
Subjective:    Patient ID: Philip Reynolds, Philip Reynolds    DOB: [birth year], 43 y.o.   MRN: [id number]  HPI  Pt presents to the clinic today with c/o a blister on his left ring finger. He noticed this. There is some redness in the area and the blister has also spread to his left arm. He does feel like it is stress related. Valtrex doesn't help. He has been seen for a similar outbreak in 2012, he was diagnosed with shingles. He has also been told that he has herpetic whitlow in the past.  Review of Systems      Past Medical History  Diagnosis Date  . Basal cell carcinoma of back   . Herpetic whitlow     left ring finger; Dr. Denyce Robert  . Other and unspecified hyperlipidemia   . Umbilical hernia without mention of obstruction or gangrene     No current outpatient prescriptions on file.   No current facility-administered medications for this visit.    No Known Allergies  Family History  Problem Relation Age of Onset  . Diabetes Father   . Stroke Father     Post radiation for brain tumor  . Cancer Father     Brain tumor  . Depression Father   . Kidney disease Neg Hx   . Alcohol abuse Neg Hx   . Drug abuse Neg Hx   . CAD Neg Hx     History   Social History  . Marital Status: Married    Spouse Name: N/A    Number of Children: 0  . Years of Education: N/A   Occupational History  . 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 Smoking status: Never Smoker   . Smokeless tobacco: Never Used  . Alcohol Use: Yes     Comment: Occasional  . Drug Use: No  . Sexual Activity: Not on file   Other Topics Concern  . Not on file   Social History Narrative   Lives with wife Philip Reynolds   Occ: Warehouse manager business.   Activity: regular gym 5d /wk   Diet: tries to eat healthy, avoids fast food, good water, fruits/vegetables daily     Constitutional: Denies fever, malaise, fatigue, headache or abrupt weight changes.  Skin: Pt  reports rash on left ring finger and left arm.     No other specific complaints in a complete review of systems (except as listed in HPI above).  Objective:   Physical Exam  BP 122/76  Pulse 70  Temp(Src) 98.2 F (36.8 C) (Oral)  Wt 200 lb (90.719 kg)  SpO2 98% Wt Readings from Last 3 Encounters:  12/16/13 200 lb (90.719 kg)  09/24/13 201 lb (91.173 kg)  08/19/13 199 lb (90.266 kg)    General: Appears his stated age, well developed, well nourished in NAD. Skin: Warm, dry and intact. Dime size blister noted on left posterior ring finger with red streak up the posterior forearm. Cardiovascular: Normal rate and rhythm. S1,S2 noted.  No murmur, rubs or gallops noted. No JVD or BLE edema. No carotid bruits noted. Pulmonary/Chest: Normal effort and positive vesicular breath sounds. No respiratory distress. No wheezes, rales or ronchi noted.    BMET    Component Value Date/Time   NA 136 07/16/2013 0950   K 5.1 07/16/2013 0950   CL 103 07/16/2013 0950   CO2 29 07/16/2013 0950   GLUCOSE 101* 07/16/2013 0950  BUN 16 07/16/2013 0950   CREATININE 1.1 07/16/2013 0950   CALCIUM 9.8 07/16/2013 0950   GFRNONAA 101.45 06/01/2009 1006    Lipid Panel     Component Value Date/Time   CHOL 236* 07/16/2013 0950   TRIG 218.0* 07/16/2013 0950   HDL 29.80* 07/16/2013 0950   CHOLHDL 8 07/16/2013 0950   VLDL 43.6* 07/16/2013 0950    CBC    Component Value Date/Time   WBC 6.9 06/01/2009 1006   RBC 5.20 06/01/2009 1006   HGB 16.0 06/01/2009 1006   HCT 45.9 06/01/2009 1006   PLT 215.0 06/01/2009 1006   MCV 88.3 06/01/2009 1006   MCHC 34.9 06/01/2009 1006   RDW 12.6 06/01/2009 1006   LYMPHSABS 2.1 06/01/2009 1006   MONOABS 0.5 06/01/2009 1006   EOSABS 0.1 06/01/2009 1006   BASOSABS 0.0 06/01/2009 1006    Hgb A1C No results found for this basename: HGBA1C         Assessment & Plan:   Shingles vs herpetic whitlow:  Valtrax ineffective for him He is usually prescribed vicodin 5-500  tabs rx given for vicodin 5-325 tabs  RTC as needed

## 2013-12-16 NOTE — Progress Notes (Signed)
Pre visit review using our clinic review tool, if applicable. No additional management support is needed unless otherwise documented below in the visit note. 

## 2014-06-18 ENCOUNTER — Encounter: Payer: Self-pay | Admitting: Family Medicine

## 2014-06-18 ENCOUNTER — Ambulatory Visit (INDEPENDENT_AMBULATORY_CARE_PROVIDER_SITE_OTHER): Payer: No Typology Code available for payment source | Admitting: Family Medicine

## 2014-06-18 VITALS — BP 100/72 | HR 71 | Temp 98.1°F | Ht 72.0 in | Wt 200.5 lb

## 2014-06-18 DIAGNOSIS — M5416 Radiculopathy, lumbar region: Secondary | ICD-10-CM

## 2014-06-18 DIAGNOSIS — M5440 Lumbago with sciatica, unspecified side: Secondary | ICD-10-CM

## 2014-06-18 MED ORDER — HYDROCODONE-ACETAMINOPHEN 5-325 MG PO TABS: 1.0000 | ORAL_TABLET | Freq: Four times a day (QID) | ORAL | Status: DC | PRN

## 2014-06-18 MED ORDER — CYCLOBENZAPRINE HCL 10 MG PO TABS: 10.0000 mg | ORAL_TABLET | Freq: Three times a day (TID) | ORAL | Status: DC | PRN

## 2014-06-18 NOTE — Progress Notes (Signed)
Pre visit review using our clinic review tool, if applicable. No additional management support is needed unless otherwise documented below in the visit note. 

## 2014-06-18 NOTE — Progress Notes (Signed)
Dr. Frederico Hamman T. Karmela Bram, MD, [facility] [place], [address] Phone: [phone] Fax: [phone]  06/18/2014  Patient: Philip Reynolds, MRN: [id number], DOB: 12-Feb-1971, 43 y.o.  Primary Physician:  Ria Bush, MD  Chief Complaint: Back Pain  Subjective:   Philip Reynolds is a 43 y.o. very pleasant male patient who presents with the following: Back Pain  ongoing for approximately: 7 d The patient has had back pain before. The back pain is localized into the lumbar spine area. They also describe LEFT acute radiculopathy.  Driving tomorrow with back pain.  10 times ago and went down to the floor, and down for 15 minutes.   Now it is moving ok.  600 mg ibuprofen at night.  + LEFT LOWER LEG numbness or tingling. No bowel or bladder incontinence. No focal weakness. Prior interventions: none Physical therapy: No Chiropractic manipulations: No Acupuncture: No Osteopathic manipulation: No Heat or cold: Minimal effect  Past Medical History, Surgical History, Family History, Medications, Allergies have been reviewed and updated if relevant.  GEN: No fevers, chills. Nontoxic. Primarily MSK c/o today. MSK: Detailed in the HPI GI: tolerating PO intake without difficulty Neuro: As above  Otherwise the pertinent positives of the ROS are noted above.    Objective:   Blood pressure 100/72, pulse 71, temperature 98.1 F (36.7 C), temperature source Oral, height 6' (1.829 m), weight 200 lb 8 oz (90.946 kg).  Gen: Well-developed,well-nourished,in no acute distress; alert,appropriate and cooperative throughout examination HEENT: Normocephalic and atraumatic without obvious abnormalities.  Ears, externally no deformities Pulm: Breathing comfortably in no respiratory distress Range of motion at  the waist: Flexion, rotation and lateral bending: flexion to 50, mild loss of motion at ext and lateral bending  No  echymosis or edema Rises to examination table with mild difficulty Gait: minimally antalgic  Inspection/Deformity: No abnormality Paraspinus T:  l3-s1 mildly tender  B Ankle Dorsiflexion (L5,4): 5/5 B Great Toe Dorsiflexion (L5,4): 5/5 Heel Walk (L5): WNL Toe Walk (S1): WNL Rise/Squat (L4): WNL, mild pain  SENSORY B Medial Foot (L4): WNL B Dorsum (L5): WNL B Lateral (S1): decreased, leg and foot  REFLEXES Knee (L4): 2+ Ankle (S1): 2+  B SLR, seated: neg B SLR, supine: + B FABER: neg B Reverse FABER: neg B Greater Troch: NT B Log Roll: neg B Stork: NT B Sciatic Notch: NT  Radiology: No results found.  Assessment and Plan:   Lumbar radiculopathy, acute  Low back pain with sciatica, sciatica laterality unspecified, unspecified back pain laterality - Plan: HYDROcodone-acetaminophen (NORCO/VICODIN) 5-325 MG per tablet  Doing remarkably well with probable disc herniation, by report improved a lot since onset.  ROM, heat  Cont nsaids Motrin 600 - 800 mg recommended TID. (Over the counter Motrin, Advil, or Generic Ibuprofen 200 mg tablets. 3-4 tablets by mouth 3 times a day. This equals a prescription strength dose.)   Flexeril at night and pain meds if needed. Going to [place] this weekend.  Follow-up: if not improved in 1 mo  New Prescriptions   CYCLOBENZAPRINE (FLEXERIL) 10 MG TABLET    Take 1 tablet (10 mg total) by mouth 3 (three) times daily as needed for muscle spasms.   No orders of the defined types were placed in this encounter.    Signed,  Maud Deed. Railee Bonillas, MD   Patient's Medications  New Prescriptions   CYCLOBENZAPRINE (FLEXERIL) 10 MG TABLET    Take 1 tablet (10 mg  total) by mouth 3 (three) times daily as needed for muscle spasms.  Previous Medications   No medications on file  Modified Medications   Modified Medication Previous Medication   HYDROCODONE-ACETAMINOPHEN (NORCO/VICODIN) 5-325 MG PER TABLET HYDROcodone-acetaminophen  (NORCO/VICODIN) 5-325 MG per tablet      Take 1 tablet by mouth every 6 (six) hours as needed for moderate pain.    Take 1 tablet by mouth every 6 (six) hours as needed for moderate pain.  Discontinued Medications   No medications on file

## 2014-10-10 ENCOUNTER — Other Ambulatory Visit: Payer: Self-pay

## 2014-10-10 ENCOUNTER — Encounter: Payer: Self-pay | Admitting: Family Medicine

## 2014-10-10 ENCOUNTER — Ambulatory Visit (INDEPENDENT_AMBULATORY_CARE_PROVIDER_SITE_OTHER): Payer: BLUE CROSS/BLUE SHIELD | Admitting: Family Medicine

## 2014-10-10 VITALS — BP 116/84 | HR 76 | Temp 98.0°F | Wt 207.2 lb

## 2014-10-10 DIAGNOSIS — M5440 Lumbago with sciatica, unspecified side: Secondary | ICD-10-CM

## 2014-10-10 DIAGNOSIS — B009 Herpesviral infection, unspecified: Secondary | ICD-10-CM | POA: Insufficient documentation

## 2014-10-10 HISTORY — DX: Herpesviral infection, unspecified: B00.9

## 2014-10-10 MED ORDER — FAMCICLOVIR 500 MG PO TABS: 500.0000 mg | ORAL_TABLET | Freq: Three times a day (TID) | ORAL | Status: DC

## 2014-10-10 MED ORDER — HYDROCODONE-ACETAMINOPHEN 5-325 MG PO TABS: 1.0000 | ORAL_TABLET | Freq: Four times a day (QID) | ORAL | Status: DC | PRN

## 2014-10-10 NOTE — Progress Notes (Signed)
Pre visit review using our clinic review tool, if applicable. No additional management support is needed unless otherwise documented below in the visit note. 

## 2014-10-10 NOTE — Assessment & Plan Note (Signed)
Discussed ddx - herpes zoster vs herpes simplex infection. Anticipate more recurrent HSV 1 infection given infection tends to localize to R nare and h/o herpetic whitlow as well.  Treat with famcyclovir - valtrex ineffective according to patient in the past. Hydrocodone for pain. Could consider viral culture if draining present vs labwork for HSV 1/2. Pt agrees with plan.

## 2014-10-10 NOTE — Patient Instructions (Addendum)
Hydrocodone refilled. Try famcyclovir course to speed recovery. Continue warm compresses.  Seek urgent care or return if any fever >101 or vision affected.

## 2014-10-10 NOTE — Telephone Encounter (Signed)
Philip Reynolds pts wife said pt started with new outbreak of shingles on 10/08/14, blister and rash on nose,and going down side of face; not at eye. Does pt need to be seen? Philip Reynolds said pt seen 11/2013 for shingles and when pt gets stressed shingles pop up. Pt requesting rx for hydrocodone apap.

## 2014-10-10 NOTE — Telephone Encounter (Signed)
Spoke with Jeani Hawking; pt is at work but could be here at 4:30pm. Morey Hummingbird will add to Dr Synthia Innocent schedule.

## 2014-10-10 NOTE — Telephone Encounter (Signed)
Given location I recommend he be seen today. Can he come in at 1:30 now?

## 2014-10-10 NOTE — Progress Notes (Signed)
   BP 116/84 mmHg  Pulse 76  Temp(Src) 98 F (36.7 C) (Oral)  Wt 207 lb 4 oz (94.008 kg)   CC: herpes infectino on face  Subjective:    Patient ID: Philip Reynolds, male    DOB: 1970/09/29, 44 y.o.   MRN: [id number]  HPI: Philip Reynolds is a 44 y.o. male presenting on 10/10/2014 for Herpes Zoster   Recurrent herpes infection started 4-5 days ago. Tends to last several weeks. Sore that develops on right nostril associated with sharp internal pains R side of face. Stress related. No fevers/chills, ear pain.   H/o recurrent herpes on face. Has never affected eye. ?zoster vs simplex, unclear.  H/o herpetic whitlow in past to right ring finger. Saw [facility] specialist in the past.  Valtrex ineffective (both abortive and preventatively). Unsure if he's tried acyclovir or famcyclovir.   Relevant past medical, surgical, family and social history reviewed and updated as indicated. Interim medical history since our last visit reviewed. Allergies and medications reviewed and updated. Current Outpatient Prescriptions on File Prior to Visit  Medication Sig  . cyclobenzaprine (FLEXERIL) 10 MG tablet Take 1 tablet (10 mg total) by mouth 3 (three) times daily as needed for muscle spasms. (Patient not taking: Reported on 10/10/2014)   No current facility-administered medications on file prior to visit.    Review of Systems Per HPI unless specifically indicated above     Objective:    BP 116/84 mmHg  Pulse 76  Temp(Src) 98 F (36.7 C) (Oral)  Wt 207 lb 4 oz (94.008 kg)  Wt Readings from Last 3 Encounters:  10/10/14 207 lb 4 oz (94.008 kg)  06/18/14 200 lb 8 oz (90.946 kg)  12/16/13 200 lb (90.719 kg)    Physical Exam  Constitutional: He appears well-developed and well-nourished. No distress.  HENT:  Right Ear: Hearing, tympanic membrane, external ear and ear canal normal.  Left Ear: Hearing, tympanic membrane, external ear and ear canal normal.  Nose: Mucosal edema present. No  rhinorrhea.    Mouth/Throat: Uvula is midline, oropharynx is clear and moist and mucous membranes are normal. No oropharyngeal exudate, posterior oropharyngeal edema, posterior oropharyngeal erythema or tonsillar abscesses.  R nare edematous and swollen, 2 crusted lesions - one on outer lateral nare and one anterior nare at entrance  Eyes: Conjunctivae and EOM are normal. Pupils are equal, round, and reactive to light. No scleral icterus.  Neck: Normal range of motion. Neck supple.  Lymphadenopathy:    He has cervical adenopathy (R submandibular LAD).  Nursing note and vitals reviewed.     Assessment & Plan:   Problem List Items Addressed This Visit    Herpes infection - Primary    Discussed ddx - herpes zoster vs herpes simplex infection. Anticipate more recurrent HSV 1 infection given infection tends to localize to R nare and h/o herpetic whitlow as well.  Treat with famcyclovir - valtrex ineffective according to patient in the past. Hydrocodone for pain. Could consider viral culture if draining present vs labwork for HSV 1/2. Pt agrees with plan.      Relevant Medications   HYDROcodone-acetaminophen (NORCO/VICODIN) 5-325 MG per tablet   famciclovir (FAMVIR) 500 MG tablet       Follow up plan: Return if symptoms worsen or fail to improve.

## 2014-12-16 ENCOUNTER — Encounter: Payer: Self-pay | Admitting: Family Medicine

## 2014-12-16 ENCOUNTER — Ambulatory Visit (INDEPENDENT_AMBULATORY_CARE_PROVIDER_SITE_OTHER): Payer: BLUE CROSS/BLUE SHIELD | Admitting: Family Medicine

## 2014-12-16 VITALS — BP 132/88 | HR 68 | Temp 98.2°F | Wt 208.1 lb

## 2014-12-16 DIAGNOSIS — B009 Herpesviral infection, unspecified: Secondary | ICD-10-CM

## 2014-12-16 MED ORDER — HYDROCODONE-ACETAMINOPHEN 5-325 MG PO TABS: 1.0000 | ORAL_TABLET | Freq: Four times a day (QID) | ORAL | Status: DC | PRN

## 2014-12-16 NOTE — Progress Notes (Signed)
Pre visit review using our clinic review tool, if applicable. No additional management support is needed unless otherwise documented below in the visit note. 

## 2014-12-16 NOTE — Assessment & Plan Note (Signed)
Most consistent with herpetic whitlow - sxs ongoing for 1 week but I still encouraged him to try famcyclovir.  vicodin and ibuprofen for pain. If large blister forms in the future, discussed option of viral culture. Pt defers for now.

## 2014-12-16 NOTE — Progress Notes (Signed)
   BP 132/88 mmHg  Pulse 68  Temp(Src) 98.2 F (36.8 C) (Oral)  Wt 208 lb 1.9 oz (94.403 kg)   CC: ?shingles L ring finger  Subjective:    Patient ID: Philip Reynolds, male    DOB: 07-24-1971, 44 y.o.   MRN: [id number]  HPI: Philip Reynolds is a 44 y.o. male presenting on 12/16/2014 for Herpes Zoster   H/o chronic recurrent herpetic whitlow L ring finger dx by [facility] specialist 20 yrs ago. This time pain started 1 wk ago, blister started Friday. 4th episode in the last year. Very stress related - currently working 7d weeks at work.  Treating pain with vicodin and ibuprofen.  Did not try famcyclovir yet (prescribed last visit).   Valtrex ineffective (both abortive and preventatively). Unsure if he's tried acyclovir or famcyclovir.   Relevant past medical, surgical, family and social history reviewed and updated as indicated. Interim medical history since our last visit reviewed. Allergies and medications reviewed and updated. Current Outpatient Prescriptions on File Prior to Visit  Medication Sig  . ibuprofen (ADVIL,MOTRIN) 200 MG tablet Take 200 mg by mouth as needed.  . cyclobenzaprine (FLEXERIL) 10 MG tablet Take 1 tablet (10 mg total) by mouth 3 (three) times daily as needed for muscle spasms. (Patient not taking: Reported on 10/10/2014)  . famciclovir (FAMVIR) 500 MG tablet Take 1 tablet (500 mg total) by mouth 3 (three) times daily. (Patient not taking: Reported on 12/16/2014)   No current facility-administered medications on file prior to visit.    Review of Systems Per HPI unless specifically indicated above     Objective:    BP 132/88 mmHg  Pulse 68  Temp(Src) 98.2 F (36.8 C) (Oral)  Wt 208 lb 1.9 oz (94.403 kg)  Wt Readings from Last 3 Encounters:  12/16/14 208 lb 1.9 oz (94.403 kg)  10/10/14 207 lb 4 oz (94.008 kg)  06/18/14 200 lb 8 oz (90.946 kg)    Physical Exam  Constitutional: He appears well-developed and well-nourished. No distress.  Skin: Skin is  warm and dry. Rash noted.  L lateral ring finger with 2 pustules on erythematous base between IP joints No streaking erythema  Nursing note and vitals reviewed.     Assessment & Plan:   Problem List Items Addressed This Visit    Herpes infection - Primary    Most consistent with herpetic whitlow - sxs ongoing for 1 week but I still encouraged him to try famcyclovir.  vicodin and ibuprofen for pain. If large blister forms in the future, discussed option of viral culture. Pt defers for now.      Relevant Medications   HYDROcodone-acetaminophen (NORCO/VICODIN) 5-325 MG per tablet       Follow up plan: Return if symptoms worsen or fail to improve.

## 2014-12-16 NOTE — Patient Instructions (Addendum)
Vicodin refilled. Try famcyclovir course - best to take at onset of burning pain.  Let us know if becoming more recurrent.  Watch for streaking redness or not improving as expected.  Herpetic Whitlow Herpetic whitlow is a painful infection of the hand. It can involve 1 or more fingers. It usually affects the end of the finger. This is caused by the Herpes simplex virus 1 (HSV-1) and herpes simplex virus 2 (HSV-2). It is an occupational risk among health care workers.  Herpetic whitlow is characterized by a starting infection, which may be followed by a problem-free period but with future recurrences. After the initial infection, the virus enters nerve endings and lies dormant in those nerves. The primary infection usually is the most troublesome. Recurrences observed in 20-50% of cases are usually milder and shorter in duration. Once nerves are infected with herpes virus they are thought to contain that virus for the rest of your life. CAUSES  Males and females are affected equally by herpetic whitlow. In health care workers, infection with HSV-1 is most common. It comes from exposure to infected secretions from the mouths of patients. Herpetic whitlow is started by exposure to infected body fluids. The virus gets in through a break in the skin. This could be any small thing such as a torn cuticle. The virus then invades the skin cells. Signs of infection show in days. In children, HSV-1 is the most likely cause. Infection involving the finger usually is due to finger-sucking or thumb-sucking in patients with herpes infection. Toddlers and preschool children are most likely to engage in thumb-sucking or finger-sucking behavior. They are susceptible to herpetic whitlow if they have herpes infection of the mouth. SYMPTOMS   Following exposure, problems usually develop within 2-20 days (incubation period). Sometimes fever and sleepiness are observed. Most often initial symptoms are pain and burning or  tingling of the infected digit.  This usually is followed by redness, swelling. There will be development of rice sized vesicles on a red base over the next 7-10 days.  These vesicles may ulcerate or break. They usually contain clear fluid. But the fluid may appear cloudy or bloody. Inflammation of the lymph channels which return the body fluids to the heart and lymph nodes (swollen glands) are common. After 10-14 days, symptoms usually improve. The sores (lesions) crust over and heal.  The infectious phase is believed to be over at this point. Complete resolution happens over the next 5-7 days.  Problems from this infection are usually related to secondary infections. Complications may include delayed resolution, bacterial overgrowth. These rarely spread throughout the body with serious consequences. DIAGNOSIS  The diagnosis is usually easily made on physical exam. Sometimes lab work is needed. HOME CARE INSTRUCTIONS   Only take over-the-counter or prescription medicines for pain, discomfort, or fever as directed by your caregiver. Do not use aspirin.  Do not touch the blisters or pick the scabs. Wash your hands often. Do not touch your eyes, mouth or genital areas without washing your hands first. Do not share towels and washcloths.  Apply an ice pack to the sore area for discomfort.  This infection is contagious. Avoid close contact with other people until blisters heal. This can be transferred to both the mouth and the genital area.  Eat a well-balanced diet.  This problem can be prevented by use of gloves. Observe fluid precautions if you are handling people.  In the general adult population, herpetic whitlow is most often from yourself. It is most  frequently secondary to infection with HSV-2. MAKE SURE YOU:   Understand these instructions.  Will watch your condition.  Will get help right away if you are not doing well or get worse. Document Released: 11/05/2002 Document  Revised: 12/30/2013 Document Reviewed: 04/03/2008 [facility] Patient Information 2015 [place], [place]. This information is not intended to replace advice given to you by your health care provider. Make sure you discuss any questions you have with your health care provider.

## 2014-12-19 NOTE — Op Note (Signed)
PATIENT NAME:  Philip Reynolds, Philip Reynolds MR#:  [id number] DATE OF BIRTH:  1971-04-21  DATE OF PROCEDURE:  08/06/2013  PREOPERATIVE DIAGNOSES:  1.  Right inguinal hernia.  2.  Umbilical hernia.   OPERATION PERFORMED: Laparoscopy and repair of right inguinal hernia and open repair of umbilical hernia.   SURGEON: S.G. Jamal Collin, M.D.   ANESTHESIA: General.   COMPLICATIONS: None.   ESTIMATED BLOOD LOSS: Minimal, less than 25 mL.   DRAINS: None.   DESCRIPTION OF PROCEDURE: The patient was put to sleep and then a Foley catheter was inserted and this catheter was removed at the end of the procedure. The abdomen was prepped and draped out as a sterile field. Timeout was performed. A small umbilical hernia about 1.5 to 2 cm size was noted at the umbilicus near the upper lip. Incision was made along the upper lip of the umbilicus and the hernial protrusion, which was mainly preperitoneal fat, was easily pushed back through a 5 mm fascial defect. Through this, a Veress needle was positioned in the peritoneal cavity and verified with the hanging drop method. Pneumoperitoneum was obtained and subsequently an 11 mm XL port was placed. The camera angled camera was utilized and there was good visualization of the abdominal wall, and it was noted that the patient had a fairly sizable indirect sac identified in the right inguinal region. There also appeared to be some beginning of a hernia on the left side with a slight dimple in the peritoneum, about a cm deep, that was noted just lateral to the inferior epigastric vessel at the side of the internal ring. This was, however, felt to be best left alone since it did not constitute a full hernia at this point.   Attention was directed to the right inguinal hernia. Two 5 mm ports were placed, one on the right and one on the left flank, and the peritoneum overlying the inguinal canal was then incised with the use of scissors and cautery, taking care to avoid any injury to the  inferior epigastric vessels.   The peritoneum was then dissected off from the posterior wall of the inguinal canal and carefully exposed. The pubic symphysis and the Cooper ligament were medial to the inferior epigastric vessels. Subsequent dissection was performed to free up the hernial sac. It was noted that the patient had a very long lipoma of the cord which was first dissected off, freed, cauterized, and removed. This was removed through the main port site and discarded.   The sac itself was then further dissected until the vas deferens and the adjoining blood vessels were all identified forming the cord structures,  and after the sac was satisfactorily freed, repair was obtained with the use of a Bard mesh. This was a hernia mesh. It was then placed through the central port site and then placed up against the posterior wall of the inguinal canal. It was positioned with the medial edge against the pubic tubercle area and tacked down with 3 to 4 secure strap tackers. Also some additional tacks were placed superiorly. It was noted to be proper positioning of this mesh with a good reinforcement the posterior wall.   The peritoneum was then reapproximated  securestrap. The main 11 mm port was removed and the umbilical hernial defect still measured only about 6 mm in size. It was decided to do a primary repair. Using a suture passer, a figure-of-eight stitch of 0 Prolene was used to close this defect completely and did not require  any additional repair. The pneumoperitoneum was then released and the ports were removed. All the skin incisions were closed with subcuticular 4-0 Vicryl, covered with Dermabond. The procedure was well tolerated. He was subsequently extubated and returned to the recovery room in stable condition.   ____________________________ S.Robinette Haines, MD sgs:np D: 08/06/2013 14:46:00 ET T: 08/06/2013 16:33:14 ET JOB#: [id number]  cc: S.G. Jamal Collin, MD, <Dictator> [facility] Robinette Haines  MD ELECTRONICALLY SIGNED 08/09/2013 12:09

## 2015-07-21 ENCOUNTER — Ambulatory Visit: Payer: BLUE CROSS/BLUE SHIELD | Admitting: Family Medicine

## 2015-09-14 ENCOUNTER — Other Ambulatory Visit: Payer: Self-pay | Admitting: Family Medicine

## 2015-09-14 DIAGNOSIS — E785 Hyperlipidemia, unspecified: Secondary | ICD-10-CM

## 2015-09-15 ENCOUNTER — Other Ambulatory Visit (INDEPENDENT_AMBULATORY_CARE_PROVIDER_SITE_OTHER): Payer: Commercial Managed Care - HMO

## 2015-09-15 DIAGNOSIS — E785 Hyperlipidemia, unspecified: Secondary | ICD-10-CM

## 2015-09-15 LAB — LIPID PANEL
CHOLESTEROL: 168 mg/dL (ref 0–200)
HDL: 17.6 mg/dL — ABNORMAL LOW (ref >39.00)
LDL CALC: 118 mg/dL — AB (ref 0–99)
NonHDL: 150.51
TRIGLYCERIDES: 164 mg/dL — AB (ref 0.0–149.0)
Total CHOL/HDL Ratio: 10
VLDL: 32.8 mg/dL (ref 0.0–40.0)

## 2015-09-15 LAB — COMPREHENSIVE METABOLIC PANEL
ALBUMIN: 4.6 g/dL (ref 3.5–5.2)
ALK PHOS: 56 U/L (ref 39–117)
ALT: 39 U/L (ref 0–53)
AST: 25 U/L (ref 0–37)
BUN: 16 mg/dL (ref 6–23)
CALCIUM: 10 mg/dL (ref 8.4–10.5)
CHLORIDE: 103 meq/L (ref 96–112)
CO2: 30 mEq/L (ref 19–32)
CREATININE: 0.94 mg/dL (ref 0.40–1.50)
GFR: 93.38 mL/min (ref >60.00)
Glucose, Bld: 96 mg/dL (ref 70–99)
Potassium: 4.8 mEq/L (ref 3.5–5.1)
Sodium: 138 mEq/L (ref 135–145)
TOTAL PROTEIN: 8.1 g/dL (ref 6.0–8.3)
Total Bilirubin: 0.5 mg/dL (ref 0.2–1.2)

## 2015-09-15 LAB — TSH: TSH: 2.78 u[IU]/mL (ref 0.35–4.50)

## 2015-09-17 ENCOUNTER — Encounter: Payer: Self-pay | Admitting: Family Medicine

## 2015-09-17 ENCOUNTER — Ambulatory Visit (INDEPENDENT_AMBULATORY_CARE_PROVIDER_SITE_OTHER): Payer: Commercial Managed Care - HMO | Admitting: Family Medicine

## 2015-09-17 VITALS — BP 130/86 | HR 84 | Temp 97.7°F | Wt 204.0 lb

## 2015-09-17 DIAGNOSIS — Z23 Encounter for immunization: Secondary | ICD-10-CM

## 2015-09-17 DIAGNOSIS — E785 Hyperlipidemia, unspecified: Secondary | ICD-10-CM | POA: Diagnosis not present

## 2015-09-17 DIAGNOSIS — Z0000 Encounter for general adult medical examination without abnormal findings: Secondary | ICD-10-CM | POA: Diagnosis not present

## 2015-09-17 DIAGNOSIS — D1730 Benign lipomatous neoplasm of skin and subcutaneous tissue of unspecified sites: Secondary | ICD-10-CM

## 2015-09-17 NOTE — Assessment & Plan Note (Signed)
Preventative protocols reviewed and updated unless pt declined. Discussed healthy diet and lifestyle.  

## 2015-09-17 NOTE — Assessment & Plan Note (Addendum)
Chronic, stable. Improved on RYR BID. rec continue this.

## 2015-09-17 NOTE — Progress Notes (Signed)
BP 130/86 mmHg  Pulse 84  Temp(Src) 97.7 F (36.5 C) (Oral)  Wt 204 lb (92.534 kg)   CC: CPE  Subjective:    Patient ID: Philip Reynolds, male    DOB: 1970/09/17, 45 y.o.   MRN: [id number]  HPI: Philip Reynolds is a 45 y.o. male presenting on 09/17/2015 for Annual Exam   Intermittent R knee discomfort worse if he doesn't regularly work out. H/o ACL repair and meniscectomy 2008.   Herpetic whitlow has flared 2x in the past year - occasionally in nose as well.   Preventative: Flu - today Td 2002. Tdap 2014 Seat belt use discussed Nancy Fetter screen use discussed. No changing moles on skin.  Lives with wife Gabrian Duddy Occ: Warehouse manager business. Activity: regular gym 5d /wk Diet: tries to eat healthy, avoids fast food, good water, fruits/vegetables daily  Relevant past medical, surgical, family and social history reviewed and updated as indicated. Interim medical history since our last visit reviewed. Allergies and medications reviewed and updated. No current outpatient prescriptions on file prior to visit.   No current facility-administered medications on file prior to visit.    Review of Systems  Constitutional: Negative for fever, chills, activity change, appetite change, fatigue and unexpected weight change.  HENT: Negative for hearing loss.   Eyes: Negative for visual disturbance.  Respiratory: Negative for cough, chest tightness, shortness of breath and wheezing.   Cardiovascular: Negative for chest pain, palpitations and leg swelling.  Gastrointestinal: Negative for nausea, vomiting, abdominal pain, diarrhea, constipation, blood in stool and abdominal distention.  Genitourinary: Negative for hematuria and difficulty urinating.  Musculoskeletal: Negative for myalgias, arthralgias and neck pain.  Skin: Negative for rash.  Neurological: Negative for dizziness, seizures, syncope and headaches.  Hematological: Negative for adenopathy. Does not bruise/bleed  easily.  Psychiatric/Behavioral: Negative for dysphoric mood. The patient is not nervous/anxious.    Per HPI unless specifically indicated in ROS section     Objective:    BP 130/86 mmHg  Pulse 84  Temp(Src) 97.7 F (36.5 C) (Oral)  Wt 204 lb (92.534 kg)  Wt Readings from Last 3 Encounters:  09/17/15 204 lb (92.534 kg)  12/16/14 208 lb 1.9 oz (94.403 kg)  10/10/14 207 lb 4 oz (94.008 kg)   Body mass index is 27.66 kg/(m^2).  Physical Exam  Constitutional: He is oriented to person, place, and time. He appears well-developed and well-nourished. No distress.  HENT:  Head: Normocephalic and atraumatic.  Right Ear: Hearing, tympanic membrane, external ear and ear canal normal.  Left Ear: Hearing, tympanic membrane, external ear and ear canal normal.  Nose: Nose normal.  Mouth/Throat: Uvula is midline, oropharynx is clear and moist and mucous membranes are normal. No oropharyngeal exudate, posterior oropharyngeal edema or posterior oropharyngeal erythema.  Eyes: Conjunctivae and EOM are normal. Pupils are equal, round, and reactive to light. No scleral icterus.  Neck: Normal range of motion. Neck supple. No thyromegaly present.  Cardiovascular: Normal rate, regular rhythm, normal heart sounds and intact distal pulses.   No murmur heard. Pulses:      Radial pulses are 2+ on the right side, and 2+ on the left side.  Pulmonary/Chest: Effort normal and breath sounds normal. No respiratory distress. He has no wheezes. He has no rales.  Abdominal: Soft. Bowel sounds are normal. He exhibits no distension and no mass. There is no tenderness. There is no rebound and no guarding.  Musculoskeletal: Normal range of motion. He exhibits no edema.  Lymphadenopathy:  He has no cervical adenopathy.  Neurological: He is alert and oriented to person, place, and time.  CN grossly intact, station and gait intact  Skin: Skin is warm and dry. No rash noted.  Psychiatric: He has a normal mood and affect.  His behavior is normal. Judgment and thought content normal.  Nursing note and vitals reviewed.  Results for orders placed or performed in visit on 09/15/15  Lipid panel  Result Value Ref Range   Cholesterol 168 0 - 200 mg/dL   Triglycerides 164.0 (H) 0.0 - 149.0 mg/dL   HDL 17.60 (L) >39.00 mg/dL   VLDL 32.8 0.0 - 40.0 mg/dL   LDL Cholesterol 118 (H) 0 - 99 mg/dL   Total CHOL/HDL Ratio 10    NonHDL 150.51   Comprehensive metabolic panel  Result Value Ref Range   Sodium 138 135 - 145 mEq/L   Potassium 4.8 3.5 - 5.1 mEq/L   Chloride 103 96 - 112 mEq/L   CO2 30 19 - 32 mEq/L   Glucose, Bld 96 70 - 99 mg/dL   BUN 16 6 - 23 mg/dL   Creatinine, Ser 0.94 0.40 - 1.50 mg/dL   Total Bilirubin 0.5 0.2 - 1.2 mg/dL   Alkaline Phosphatase 56 39 - 117 U/L   AST 25 0 - 37 U/L   ALT 39 0 - 53 U/L   Total Protein 8.1 6.0 - 8.3 g/dL   Albumin 4.6 3.5 - 5.2 g/dL   Calcium 10.0 8.4 - 10.5 mg/dL   GFR 93.38 >60.00 mL/min  TSH  Result Value Ref Range   TSH 2.78 0.35 - 4.50 uIU/mL      Assessment & Plan:   Problem List Items Addressed This Visit    Lipoma of skin and subcutaneous tissue    Several small benign lipomas noted today - R abd, R chest, R upper arm      HLD (hyperlipidemia)    Chronic, stable. Improved on RYR BID. rec continue this.      Healthcare maintenance - Primary    Preventative protocols reviewed and updated unless pt declined. Discussed healthy diet and lifestyle.        Other Visit Diagnoses    Need for influenza vaccination        Relevant Orders    Flu Vaccine QUAD 36+ mos PF IM (Fluarix & Fluzone Quad PF)        Follow up plan: Return in about 1 year (around 09/16/2016), or as needed, for annual exam, prior fasting for blood work.

## 2015-09-17 NOTE — Progress Notes (Signed)
Pre visit review using our clinic review tool, if applicable. No additional management support is needed unless otherwise documented below in the visit note. 

## 2015-09-17 NOTE — Assessment & Plan Note (Addendum)
Several small benign lipomas noted today - R abd, R chest, R upper arm

## 2015-09-17 NOTE — Patient Instructions (Addendum)
Flu shot today. You are doing well today. Continue red yeast rice twice daily.  Return as needed or in 1 year for next physical.  Health Maintenance, Male A healthy lifestyle and preventative care can promote health and wellness.  Maintain regular health, dental, and eye exams.  Eat a healthy diet. Foods like vegetables, fruits, whole grains, low-fat dairy products, and lean protein foods contain the nutrients you need and are low in calories. Decrease your intake of foods high in solid fats, added sugars, and salt. Get information about a proper diet from your health care provider, if necessary.  Regular physical exercise is one of the most important things you can do for your health. Most adults should get at least 150 minutes of moderate-intensity exercise (any activity that increases your heart rate and causes you to sweat) each week. In addition, most adults need muscle-strengthening exercises on 2 or more days a week.   Maintain a healthy weight. The body mass index (BMI) is a screening tool to identify possible weight problems. It provides an estimate of body fat based on height and weight. Your health care provider can find your BMI and can help you achieve or maintain a healthy weight. For males 20 years and older:  A BMI below 18.5 is considered underweight.  A BMI of 18.5 to 24.9 is normal.  A BMI of 25 to 29.9 is considered overweight.  A BMI of 30 and above is considered obese.  Maintain normal blood lipids and cholesterol by exercising and minimizing your intake of saturated fat. Eat a balanced diet with plenty of fruits and vegetables. Blood tests for lipids and cholesterol should begin at age 25 and be repeated every 5 years. If your lipid or cholesterol levels are high, you are over age 32, or you are at high risk for heart disease, you may need your cholesterol levels checked more frequently.Ongoing high lipid and cholesterol levels should be treated with medicines if diet  and exercise are not working.  If you smoke, find out from your health care provider how to quit. If you do not use tobacco, do not start.  Lung cancer screening is recommended for adults aged 52-80 years who are at high risk for developing lung cancer because of a history of smoking. A yearly low-dose CT scan of the lungs is recommended for people who have at least a 30-pack-year history of smoking and are current smokers or have quit within the past 15 years. A pack year of smoking is smoking an average of 1 pack of cigarettes a day for 1 year (for example, a 30-pack-year history of smoking could mean smoking 1 pack a day for 30 years or 2 packs a day for 15 years). Yearly screening should continue until the smoker has stopped smoking for at least 15 years. Yearly screening should be stopped for people who develop a health problem that would prevent them from having lung cancer treatment.  If you choose to drink alcohol, do not have more than 2 drinks per day. One drink is considered to be 12 oz (360 mL) of beer, 5 oz (150 mL) of wine, or 1.5 oz (45 mL) of liquor.  Avoid the use of street drugs. Do not share needles with anyone. Ask for help if you need support or instructions about stopping the use of drugs.  High blood pressure causes heart disease and increases the risk of stroke. High blood pressure is more likely to develop in:  People who have  blood pressure in the end of the normal range (100-139/85-89 mm Hg).  People who are overweight or obese.  People who are African American.  If you are 27-60 years of age, have your blood pressure checked every 3-5 years. If you are 25 years of age or older, have your blood pressure checked every year. You should have your blood pressure measured twice--once when you are at a hospital or clinic, and once when you are not at a hospital or clinic. Record the average of the two measurements. To check your blood pressure when you are not at a hospital or  clinic, you can use:  An automated blood pressure machine at a pharmacy.  A home blood pressure monitor.  If you are 32-67 years old, ask your health care provider if you should take aspirin to prevent heart disease.  Diabetes screening involves taking a blood sample to check your fasting blood sugar level. This should be done once every 3 years after age 15 if you are at a normal weight and without risk factors for diabetes. Testing should be considered at a younger age or be carried out more frequently if you are overweight and have at least 1 risk factor for diabetes.  Colorectal cancer can be detected and often prevented. Most routine colorectal cancer screening begins at the age of 64 and continues through age 74. However, your health care provider may recommend screening at an earlier age if you have risk factors for colon cancer. On a yearly basis, your health care provider may provide home test kits to check for hidden blood in the stool. A small camera at the end of a tube may be used to directly examine the colon (sigmoidoscopy or colonoscopy) to detect the earliest forms of colorectal cancer. Talk to your health care provider about this at age 48 when routine screening begins. A direct exam of the colon should be repeated every 5-10 years through age 20, unless early forms of precancerous polyps or small growths are found.  People who are at an increased risk for hepatitis B should be screened for this virus. You are considered at high risk for hepatitis B if:  You were born in a country where hepatitis B occurs often. Talk with your health care provider about which countries are considered high risk.  Your parents were born in a high-risk country and you have not received a shot to protect against hepatitis B (hepatitis B vaccine).  You have HIV or AIDS.  You use needles to inject street drugs.  You live with, or have sex with, someone who has hepatitis B.  You are a man who has  sex with other men (MSM).  You get hemodialysis treatment.  You take certain medicines for conditions like cancer, organ transplantation, and autoimmune conditions.  Hepatitis C blood testing is recommended for all people born from [birth year] through 1963 and any individual with known risk factors for hepatitis C.  Healthy men should no longer receive prostate-specific antigen (PSA) blood tests as part of routine cancer screening. Talk to your health care provider about prostate cancer screening.  Testicular cancer screening is not recommended for adolescents or adult males who have no symptoms. Screening includes self-exam, a health care provider exam, and other screening tests. Consult with your health care provider about any symptoms you have or any concerns you have about testicular cancer.  Practice safe sex. Use condoms and avoid high-risk sexual practices to reduce the spread of sexually transmitted infections (  STIs).  You should be screened for STIs, including gonorrhea and chlamydia if:  You are sexually active and are younger than 24 years.  You are older than 24 years, and your health care provider tells you that you are at risk for this type of infection.  Your sexual activity has changed since you were last screened, and you are at an increased risk for chlamydia or gonorrhea. Ask your health care provider if you are at risk.  If you are at risk of being infected with HIV, it is recommended that you take a prescription medicine daily to prevent HIV infection. This is called pre-exposure prophylaxis (PrEP). You are considered at risk if:  You are a man who has sex with other men (MSM).  You are a heterosexual man who is sexually active with multiple partners.  You take drugs by injection.  You are sexually active with a partner who has HIV.  Talk with your health care provider about whether you are at high risk of being infected with HIV. If you choose to begin PrEP, you should  first be tested for HIV. You should then be tested every 3 months for as long as you are taking PrEP.  Use sunscreen. Apply sunscreen liberally and repeatedly throughout the day. You should seek shade when your shadow is shorter than you. Protect yourself by wearing long sleeves, pants, a wide-brimmed hat, and sunglasses year round whenever you are outdoors.  Tell your health care provider of new moles or changes in moles, especially if there is a change in shape or color. Also, tell your health care provider if a mole is larger than the size of a pencil eraser.  A one-time screening for abdominal aortic aneurysm (AAA) and surgical repair of large AAAs by ultrasound is recommended for men aged 49-75 years who are current or former smokers.  Stay current with your vaccines (immunizations).   This information is not intended to replace advice given to you by your health care provider. Make sure you discuss any questions you have with your health care provider.   Document Released: 02/11/2008 Document Revised: 09/05/2014 Document Reviewed: 01/10/2011 Elsevier Interactive Patient Education [employer].

## 2015-11-11 ENCOUNTER — Telehealth: Payer: Self-pay | Admitting: Family Medicine

## 2015-11-11 NOTE — Telephone Encounter (Signed)
Pt has hx of back pain and "slipped disc" but pt has not had any recent problems until earlier today pt was lifting weights (150 lbs) and had immediate pain  in lower back area while lifting ; pain level now 8-9. Pt last saw Dr Lorelei Pont 06/22/14 for back pain; Dr Darnell Level is out of office this afternoon and no available appts at [facility] or [place]; pt did not want to travel to [facility] in [facility] and does not want to go to [facility]. Pt wanted to know if Dr Lorelei Pont could work pt in later today. Pt request cb.

## 2015-11-11 NOTE — Telephone Encounter (Signed)
[facility] Patient Name: Philip Reynolds DOB: September 18, 1970 Initial Comment Caller states has a disc in his back that is now in pain- Nurse Assessment Nurse: Markus Daft, RN, [place] Date/Time (Eastern Time): 11/11/2015 1:39:58 PM Confirm and document reason for call. If symptomatic, describe symptoms. You must click the next button to save text entered. ---Caller states he has had lower back pain while lifting weights at the gym today. Has the patient traveled out of the country within the last 30 days? ---Not Applicable Does the patient have any new or worsening symptoms? ---Yes Will a triage be completed? ---Yes Related visit to physician within the last 2 weeks? ---No Does the PT have any chronic conditions? (i.e. diabetes, asthma, etc.) ---Yes List chronic conditions. ---about one year ago he was diagnosed with "slipped disc in his back" - previous injury to back about 15 years ago. Is this a behavioral health or substance abuse call? ---No Guidelines Guideline Title Affirmed Question Affirmed Notes Back Pain [1] SEVERE back pain (e.g., excruciating, unable to do any normal activities) AND [2] not improved 2 hours after pain medicine Final Disposition User See Physician within 4 Hours (or PCP triage) Markus Daft, RN, [place] Comments 1) Office please call pt for appt - see if he can be worked in with Dr. Danise Mina? He does not want to go to [place] office (no available appts there either). 2) In the meantime, he was seen previously about a year ago for the same problem, and wondered if muscle relaxer could be called in? Something for pain? He know he may need to pick up a script - call him if this is the case. 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 Referrals REFERRED TO PCP OFFICE Disagree/Comply: Comply

## 2015-11-11 NOTE — Telephone Encounter (Signed)
Butch Penny CMA said Dr Lorelei Pont does not have appt to open this afternoon and can schedule appt on 11/16/15 with Dr Lorelei Pont; pt voiced understanding and said he did not want to make appt now;would try something else.

## 2015-11-13 NOTE — Telephone Encounter (Signed)
I received this message late  Would call for update. If persistent discomfort would offer muscle relaxant over weekend to see if improvement and if not rec schedule appt next week. Ensure no leg weakness. If pt would like, may send in robaxin 500mg  TID PRN pain #30 RF0.

## 2015-11-13 NOTE — Telephone Encounter (Signed)
Spoke with patient and he said he went to walk-in at [facility] clinic and had x-ray and was told ? Herniated disc. He was given flexeril, steroids and pain meds and is feeling much better now. He appreciated the call.

## 2015-12-15 ENCOUNTER — Ambulatory Visit (INDEPENDENT_AMBULATORY_CARE_PROVIDER_SITE_OTHER): Payer: 59 | Admitting: Family Medicine

## 2015-12-15 ENCOUNTER — Encounter: Payer: Self-pay | Admitting: Family Medicine

## 2015-12-15 ENCOUNTER — Ambulatory Visit: Payer: Commercial Managed Care - HMO | Admitting: Family Medicine

## 2015-12-15 VITALS — BP 108/72 | HR 79 | Temp 98.0°F | Ht 72.0 in | Wt 202.4 lb

## 2015-12-15 DIAGNOSIS — B009 Herpesviral infection, unspecified: Secondary | ICD-10-CM

## 2015-12-15 MED ORDER — FAMCICLOVIR 500 MG PO TABS: 500.0000 mg | ORAL_TABLET | Freq: Three times a day (TID) | ORAL | Status: DC

## 2015-12-15 MED ORDER — HYDROCODONE-ACETAMINOPHEN 5-325 MG PO TABS: 1.0000 | ORAL_TABLET | Freq: Three times a day (TID) | ORAL | Status: DC | PRN

## 2015-12-15 NOTE — Patient Instructions (Addendum)
Recurrent herpetic whitlow - treat with famcyclovir three times daily for 1 week, vicodin for pain.  Herpetic Whitlow Herpetic whitlow is an infection that affects the skin. It most commonly affects the fingers. The infection can recur, but the first occurrence is usually the most severe. CAUSES This condition is caused by herpes simplex virus (HSV) type 1 and HSV type 2. You can get herpetic whitlow if body fluids that are infected with either of these viruses get into a break in the skin. HSV commonly affects the mouth and genitals, but it can affect many other parts of the body. Most people who get herpetic whitlow have an HSV infection in another part of the body that spreads to the fingers through a cut. Health care workers can get herpetic whitlow from touching the mouths of patients who have oral herpes. RISK FACTORS This condition is more likely to develop in:  People who have an HSV infection.  People who work in the health care industry, particularly in dentistry. SYMPTOMS Symptoms of this condition usually develop 2-20 days after exposure to the virus. Early symptoms include:  Pain, burning, or tingling in the infected area.  Fever.  Redness.  Swelling. About 7-10 days after symptoms begin to appear, the lymph nodes may swell, and rice-sized fluid-filled bumps will develop on the infected area. These bumps will develop into sores or break open. Symptoms usually improve 10-14 days later when the sores crust over and heal. DIAGNOSIS This condition may be diagnosed with a physical exam. A sample of your skin or your blood may also be taken for testing. TREATMENT This condition goes away on its own. Your health care provider may prescribe topical or oral antiviral medicines to relieve symptoms and to prevent the infection from spreading to others. HOME CARE INSTRUCTIONS  Take or apply medicines only as directed by your health care provider.  Wash your hands often.  If you  work in the health care industry, Landscape architect.  Apply ice to the affected area:  Put ice in a plastic bag.  Place a towel between your skin and the bag.  Leave the ice on for 20 minutes, 2-3 times per day.  The infection can spread to other people. It can also spread to other areas of your body, especially to your mouth and your genital area. To keep it from spreading:  Cover the affected area with a bandage (dressing). If you work in the health care industry, Landscape architect.  Avoid close contact with other people until the bumps heal.  Do not share towels and washcloths.  Do not touch the bumps with your other hand or pick at your scabs.  Do not touch your eyes, mouth, or genital area unless you wash your hands first. SEEK MEDICAL CARE IF:  The infection has spread to another area of your body.  Your symptoms become severe.  The infection recurs.   This information is not intended to replace advice given to you by your health care provider. Make sure you discuss any questions you have with your health care provider.   Document Released: 11/05/2002 Document Revised: 12/30/2014 Document Reviewed: 05/27/2014 Elsevier Interactive Patient Education [employer].

## 2015-12-15 NOTE — Progress Notes (Signed)
   BP 108/72 mmHg  Pulse 79  Temp(Src) 98 F (36.7 C) (Oral)  Ht 6' (1.829 m)  Wt 202 lb 6.4 oz (91.808 kg)  BMI 27.44 kg/m2  SpO2 97%   CC: left hand pain  Subjective:    Patient ID: Philip Reynolds, male    DOB: [birth year], 45 y.o.   MRN: [id number]  HPI: JORGELUIS MUKES is a 45 y.o. male presenting on 12/15/2015 for Hand Pain and Arm Pain   Upcoming trip to [place].  L arm pain started last week. Noticed sore to L lateral ring finger at PIP 1-2 days. Typical course if L ring finger rash with spread of pain and some erythema up medial forearm, cycles last 2-3 wks. Requests pain medication refill as well. No new stressors. Has recently started new weight training program.   H/o recurrent herpetic whitlow to L ring finger as well as intermittently R nare of nose - seen by Dr Linus Mako at [facility] in the past. Last herpetic whitlow flare was 2.5 months ago.   Valtrex ineffective in the past. Tried valtrex ppx as well without success. We have treated with famcyclovir in the past and hydrocodone prn pain.   Relevant past medical, surgical, family and social history reviewed and updated as indicated. Interim medical history since our last visit reviewed. Allergies and medications reviewed and updated. No current outpatient prescriptions on file prior to visit.   No current facility-administered medications on file prior to visit.    Review of Systems Per HPI unless specifically indicated in ROS section     Objective:    BP 108/72 mmHg  Pulse 79  Temp(Src) 98 F (36.7 C) (Oral)  Ht 6' (1.829 m)  Wt 202 lb 6.4 oz (91.808 kg)  BMI 27.44 kg/m2  SpO2 97%  Wt Readings from Last 3 Encounters:  12/15/15 202 lb 6.4 oz (91.808 kg)  09/17/15 204 lb (92.534 kg)  12/16/14 208 lb 1.9 oz (94.403 kg)    Physical Exam  Constitutional: He appears well-developed and well-nourished. No distress.  Musculoskeletal: He exhibits no edema.  Skin: Skin is warm and dry. Rash noted. There is  erythema.  Papular erythematous crescent shaped rash left lateral ring finger at PIP. Mild discomfort to palpation.   Nursing note and vitals reviewed.      Assessment & Plan:   Problem List Items Addressed This Visit    Herpes infection - Primary    Anticipate early herpetic whitlow - rash not yet vesicular but pt states always develops into blisters.  Given early (<48 hrs since onset), start famcyclovir 500mg  TID x 1 wk, as well as printed Rx hydrocodone #30.  Will provide with refills of antiviral, discussed importance of starting med as early as possible at first signs of outbreak. Update if worsening despite antiviral. Pt agrees with plan.       Relevant Medications   HYDROcodone-acetaminophen (NORCO/VICODIN) 5-325 MG tablet   famciclovir (FAMVIR) 500 MG tablet       Follow up plan: Return if symptoms worsen or fail to improve.  Ria Bush, MD

## 2015-12-15 NOTE — Assessment & Plan Note (Signed)
Anticipate early herpetic whitlow - rash not yet vesicular but pt states always develops into blisters.  Given early (<48 hrs since onset), start famcyclovir 500mg  TID x 1 wk, as well as printed Rx hydrocodone #30.  Will provide with refills of antiviral, discussed importance of starting med as early as possible at first signs of outbreak. Update if worsening despite antiviral. Pt agrees with plan.

## 2015-12-15 NOTE — Progress Notes (Signed)
Pre visit review using our clinic review tool, if applicable. No additional management support is needed unless otherwise documented below in the visit note. 

## 2017-02-27 ENCOUNTER — Ambulatory Visit: Payer: 59 | Admitting: Family Medicine

## 2017-03-09 ENCOUNTER — Encounter: Payer: Self-pay | Admitting: Family Medicine

## 2017-03-09 ENCOUNTER — Ambulatory Visit (INDEPENDENT_AMBULATORY_CARE_PROVIDER_SITE_OTHER): Payer: Managed Care, Other (non HMO) | Admitting: Family Medicine

## 2017-03-09 VITALS — BP 118/90 | HR 83 | Temp 98.4°F | Ht 72.0 in | Wt 202.4 lb

## 2017-03-09 DIAGNOSIS — E663 Overweight: Secondary | ICD-10-CM

## 2017-03-09 DIAGNOSIS — Z13220 Encounter for screening for lipoid disorders: Secondary | ICD-10-CM | POA: Diagnosis not present

## 2017-03-09 DIAGNOSIS — Z0001 Encounter for general adult medical examination with abnormal findings: Secondary | ICD-10-CM

## 2017-03-09 DIAGNOSIS — D229 Melanocytic nevi, unspecified: Secondary | ICD-10-CM | POA: Insufficient documentation

## 2017-03-09 LAB — COMPREHENSIVE METABOLIC PANEL
ALT: 23 U/L (ref 0–53)
AST: 20 U/L (ref 0–37)
Albumin: 4 g/dL (ref 3.5–5.2)
Alkaline Phosphatase: 63 U/L (ref 39–117)
BUN: 20 mg/dL (ref 6–23)
CHLORIDE: 103 meq/L (ref 96–112)
CO2: 29 meq/L (ref 19–32)
Calcium: 9.9 mg/dL (ref 8.4–10.5)
Creatinine, Ser: 1.19 mg/dL (ref 0.40–1.50)
GFR: 70.64 mL/min (ref >60.00)
GLUCOSE: 93 mg/dL (ref 70–99)
POTASSIUM: 4.3 meq/L (ref 3.5–5.1)
SODIUM: 137 meq/L (ref 135–145)
Total Bilirubin: 0.7 mg/dL (ref 0.2–1.2)
Total Protein: 7.7 g/dL (ref 6.0–8.3)

## 2017-03-09 LAB — LIPID PANEL
CHOL/HDL RATIO: 6
Cholesterol: 202 mg/dL — ABNORMAL HIGH (ref 0–200)
HDL: 33.4 mg/dL — AB (ref >39.00)
NONHDL: 168.12
TRIGLYCERIDES: 202 mg/dL — AB (ref 0.0–149.0)
VLDL: 40.4 mg/dL — ABNORMAL HIGH (ref 0.0–40.0)

## 2017-03-09 LAB — HEMOGLOBIN A1C: Hgb A1c MFr Bld: 5.3 % (ref 4.6–6.5)

## 2017-03-09 LAB — LDL CHOLESTEROL, DIRECT: Direct LDL: 148 mg/dL

## 2017-03-09 NOTE — Patient Instructions (Signed)
Nice to see you. We will get to see dermatology for a mole on your head. We will check some lab work. We'll call you with the results.

## 2017-03-09 NOTE — Assessment & Plan Note (Addendum)
Physical exam completed. Encouraged continued diet and exercise. BP borderline elevated though did improve somewhat on recheck. He'll monitor at home. He reports it is never greater than 80 diastolically at home. Lab work as outlined below.

## 2017-03-09 NOTE — Assessment & Plan Note (Addendum)
Appears benign. We will refer to dermatology for evaluation and skin exam given history of basal cell carcinoma.

## 2017-03-09 NOTE — Progress Notes (Signed)
Philip Rumps, MD Phone: [phone]  Philip Reynolds is a 46 y.o. male who presents today for physical exam.  Exercises by going to the gym 4-5 days a week and doing cardio and weights. Usually tries to eat 5-6 small meals a day that are low in fat and carbohydrates. Has donated blood and has had HIV testing done there. Up-to-date on tetanus vaccination. No family history of colon cancer or prostate cancer. No tobacco use or illicit drug use. Drinks up to one alcoholic beverage daily though can go several weeks without any intake. Does see a dentist.  Notes a mole on his right hairline. He noticed it about a year ago. He is unsure how long it's been there. It does not bleed. Has not changed significantly. Does have a history of basal cell carcinoma on his back.  Active Ambulatory Problems    Diagnosis Date Noted  . CARCINOMA, BASAL CELL, BACK 05/28/2009  . HLD (hyperlipidemia) 09/30/2008  . Lipoma of skin and subcutaneous tissue 05/18/2011  . Encounter for general adult medical examination with abnormal findings 07/18/2013  . Herpes infection 10/10/2014  . Nevus 03/09/2017   Resolved Ambulatory Problems    Diagnosis Date Noted  . UMBILICAL HERNIA 47/04/6282  . Right inguinal hernia 07/18/2013  . Umbilical hernia 66/29/4765   Past Medical History:  Diagnosis Date  . Basal cell carcinoma of back   . Herpetic whitlow   . Other and unspecified hyperlipidemia   . Umbilical hernia without mention of obstruction or gangrene     Family History  Problem Relation Age of Onset  . Diabetes Father   . Stroke Father        Post radiation for brain tumor  . Cancer Father        Brain tumor  . Depression Father   . Kidney disease Neg Hx   . Alcohol abuse Neg Hx   . Drug abuse Neg Hx   . CAD Neg Hx     Social History   Social History  . Marital status: Married    Spouse name: N/A  . Number of children: 0  . Years of education: N/A   Occupational History  . 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 Smoking status: Never Smoker  . Smokeless tobacco: Never Used  . Alcohol use Yes     Comment: Occasional  . Drug use: No  . Sexual activity: Not on file   Other Topics Concern  . Not on file   Social History Narrative   Lives with wife Tyrion Glaude   Occ: Warehouse manager business.   Activity: regular gym 5d /wk   Diet: tries to eat healthy, avoids fast food, good water, fruits/vegetables daily    ROS  General:  Negative for nexplained weight loss, fever Skin: Positive for new or changing mole, negative for sore that won't heal HEENT: Negative for trouble hearing, trouble seeing, ringing in ears, mouth sores, hoarseness, change in voice, dysphagia. CV:  Negative for chest pain, dyspnea, edema, palpitations Resp: Negative for cough, dyspnea, hemoptysis GI: Negative for nausea, vomiting, diarrhea, constipation, abdominal pain, melena, hematochezia. GU: Negative for dysuria, incontinence, urinary hesitance, hematuria, vaginal or penile discharge, polyuria, sexual difficulty, lumps in testicle or breasts MSK: Negative for muscle cramps or aches, joint pain or swelling Neuro: Negative for headaches, weakness, numbness, dizziness, passing out/fainting Psych: Negative for depression, anxiety, memory problems  Objective  Physical Exam Vitals:  03/09/17 0938 03/09/17 0952  BP: (!) 120/94 118/90  Pulse: 83   Temp: 98.4 F (36.9 C)     BP Readings from Last 3 Encounters:  03/09/17 118/90  12/15/15 108/72  09/17/15 130/86   Wt Readings from Last 3 Encounters:  03/09/17 202 lb 6.4 oz (91.8 kg)  12/15/15 202 lb 6.4 oz (91.8 kg)  09/17/15 204 lb (92.5 kg)    Physical Exam  Constitutional: No distress.  HENT:  Head: Normocephalic and atraumatic.    Mouth/Throat: Oropharynx is clear and moist.  Eyes: Pupils are equal, round, and reactive to light. Conjunctivae are normal.  Cardiovascular:  Normal rate, regular rhythm and normal heart sounds.   Pulmonary/Chest: Effort normal and breath sounds normal.  Abdominal: Soft. Bowel sounds are normal. He exhibits no distension. There is no tenderness. There is no rebound and no guarding.  Musculoskeletal: He exhibits no edema.  Neurological: He is alert. Gait normal.  Skin: Skin is warm and dry. He is not diaphoretic.  Psychiatric: Mood and affect normal.     Assessment/Plan:   Encounter for general adult medical examination with abnormal findings Physical exam completed. Encouraged continued diet and exercise. BP borderline elevated though did improve somewhat on recheck. He'll monitor at home. He reports it is never greater than 80 diastolically at home. Lab work as outlined below.  Nevus Appears benign. We will refer to dermatology for evaluation and skin exam given history of basal cell carcinoma.   Orders Placed This Encounter  Procedures  . Lipid Profile  . Comp Met (CMET)  . HgB A1c  . Ambulatory referral to Dermatology    Referral Priority:   Routine    Referral Type:   Consultation    Referral Reason:   Specialty Services Required    Requested Specialty:   Dermatology    Number of Visits Requested:   1    No orders of the defined types were placed in this encounter.    Philip Rumps, MD [place]

## 2018-03-14 ENCOUNTER — Ambulatory Visit (INDEPENDENT_AMBULATORY_CARE_PROVIDER_SITE_OTHER): Payer: Managed Care, Other (non HMO) | Admitting: Family Medicine

## 2018-03-14 ENCOUNTER — Encounter: Payer: Self-pay | Admitting: Family Medicine

## 2018-03-14 VITALS — BP 130/90 | HR 78 | Temp 97.8°F | Ht 72.0 in | Wt 212.0 lb

## 2018-03-14 DIAGNOSIS — Z0000 Encounter for general adult medical examination without abnormal findings: Secondary | ICD-10-CM | POA: Diagnosis not present

## 2018-03-14 DIAGNOSIS — E663 Overweight: Secondary | ICD-10-CM

## 2018-03-14 DIAGNOSIS — E785 Hyperlipidemia, unspecified: Secondary | ICD-10-CM

## 2018-03-14 LAB — COMPREHENSIVE METABOLIC PANEL
ALK PHOS: 57 U/L (ref 39–117)
ALT: 24 U/L (ref 0–53)
AST: 20 U/L (ref 0–37)
Albumin: 4.5 g/dL (ref 3.5–5.2)
BUN: 18 mg/dL (ref 6–23)
CO2: 26 meq/L (ref 19–32)
Calcium: 9.4 mg/dL (ref 8.4–10.5)
Chloride: 106 mEq/L (ref 96–112)
Creatinine, Ser: 1.14 mg/dL (ref 0.40–1.50)
GFR: 73.88 mL/min (ref >60.00)
GLUCOSE: 104 mg/dL — AB (ref 70–99)
POTASSIUM: 4.2 meq/L (ref 3.5–5.1)
SODIUM: 140 meq/L (ref 135–145)
TOTAL PROTEIN: 7.5 g/dL (ref 6.0–8.3)
Total Bilirubin: 0.6 mg/dL (ref 0.2–1.2)

## 2018-03-14 LAB — LIPID PANEL
Cholesterol: 203 mg/dL — ABNORMAL HIGH (ref 0–200)
HDL: 31.5 mg/dL — AB (ref >39.00)
NonHDL: 171.53
Total CHOL/HDL Ratio: 6
Triglycerides: 264 mg/dL — ABNORMAL HIGH (ref 0.0–149.0)
VLDL: 52.8 mg/dL — AB (ref 0.0–40.0)

## 2018-03-14 LAB — LDL CHOLESTEROL, DIRECT: Direct LDL: 140 mg/dL

## 2018-03-14 LAB — HEMOGLOBIN A1C: Hgb A1c MFr Bld: 5.3 % (ref 4.6–6.5)

## 2018-03-14 NOTE — Assessment & Plan Note (Addendum)
Physical exam completed.  Encouraged continued exercise and monitoring his diet.  Lab work as outlined below.  Encouraged him to see an optometrist to check his vision.  Patient's blood pressure is on the borderline of being elevated.  He will start checking it at home and he will contact us in 2 weeks with numbers.  Symptom of feeling like he cannot get a deep breath when he has not been exercising could be related to deconditioning.

## 2018-03-14 NOTE — Patient Instructions (Signed)
Nice to see you. Please continue to monitor your diet and work on exercise.   We will contact you with your lab results.

## 2018-03-14 NOTE — Progress Notes (Signed)
Philip Rumps, MD Phone: [phone]  Philip Reynolds is a 47 y.o. male who presents today for CPE.  Exercises 4 to 5 days a week by going to the gym. Diet is healthy with protein and fruit shake in the morning.  Protein bars for snack.  Chicken and salad or fish and salad for dinner.  Lunch sometimes unhealthy with eating out. Tetanus vaccination up-to-date. HIV screening: Notes he has donated blood previously. No family history of prostate or colon cancer. No tobacco use or illicit drug use.  5-6 alcoholic beverages a week. Sees a dentist twice a year.  Does not see an ophthalmologist though does note some near vision issues.  No distance issues. Patient does note a chronic issue of feeling like he cannot take a really deep breath if he has not been exercising consistently.  This is not an issue if he exercises consistently.  He has no shortness of breath.  Active Ambulatory Problems    Diagnosis Date Noted  . CARCINOMA, BASAL CELL, BACK 05/28/2009  . HLD (hyperlipidemia) 09/30/2008  . Lipoma of skin and subcutaneous tissue 05/18/2011  . Routine general medical examination at a health care facility 07/18/2013  . Herpes infection 10/10/2014  . Nevus 03/09/2017   Resolved Ambulatory Problems    Diagnosis Date Noted  . UMBILICAL HERNIA 59/93/5701  . Right inguinal hernia 07/18/2013  . Umbilical hernia 77/93/9030   Past Medical History:  Diagnosis Date  . Basal cell carcinoma of back   . Herpetic whitlow   . Other and unspecified hyperlipidemia   . Umbilical hernia without mention of obstruction or gangrene     Family History  Problem Relation Age of Onset  . Diabetes Father   . Stroke Father        Post radiation for brain tumor  . Cancer Father        Brain tumor  . Depression Father   . Kidney disease Neg Hx   . Alcohol abuse Neg Hx   . Drug abuse Neg Hx   . CAD Neg Hx     Social History   Socioeconomic History  . Marital status: Married    Spouse name:  Not on file  . Number of children: 0  . Years of education: Not on file  . Highest education level: Not on file  Occupational History  . 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 Financial resource strain: Not on file  . Food insecurity:    Worry: Not on file    Inability: Not on file  . Transportation needs:    Medical: Not on file    Non-medical: Not on file  Tobacco Use  . Smoking status: Never Smoker  . Smokeless tobacco: Never Used  Substance and Sexual Activity  . Alcohol use: Yes    Comment: Occasional  . Drug use: No  . Sexual activity: Not on file  Lifestyle  . Physical activity:    Days per week: Not on file    Minutes per session: Not on file  . Stress: Not on file  Relationships  . Social connections:    Talks on phone: Not on file    Gets together: Not on file    Attends religious service: Not on file    Active member of club or organization: Not on file    Attends meetings of clubs or organizations: Not on file    Relationship status: Not on file  .  Intimate partner violence:    Fear of current or ex partner: Not on file    Emotionally abused: Not on file    Physically abused: Not on file    Forced sexual activity: Not on file  Other Topics Concern  . Not on file  Social History Narrative   Lives with wife Terrance Usery   Occ: Warehouse manager business.   Activity: regular gym 5d /wk   Diet: tries to eat healthy, avoids fast food, good water, fruits/vegetables daily    ROS  General:  Negative for nexplained weight loss, fever Skin: Negative for new or changing mole, sore that won't heal HEENT: Negative for trouble hearing, trouble seeing, ringing in ears, mouth sores, hoarseness, change in voice, dysphagia. CV:  Negative for chest pain, dyspnea, edema, palpitations Resp: Negative for cough, dyspnea, hemoptysis GI: Negative for nausea, vomiting, diarrhea, constipation, abdominal pain, melena,  hematochezia. GU: Negative for dysuria, incontinence, urinary hesitance, hematuria, vaginal or penile discharge, polyuria, sexual difficulty, lumps in testicle or breasts MSK: Negative for muscle cramps or aches, joint pain or swelling Neuro: Negative for headaches, weakness, numbness, dizziness, passing out/fainting Psych: Negative for depression, anxiety, memory problems  Objective  Physical Exam Vitals:   03/14/18 0926  BP: 130/90  Pulse: 78  Temp: 97.8 F (36.6 C)  SpO2: 97%    BP Readings from Last 3 Encounters:  03/14/18 130/90  03/09/17 118/90  12/15/15 108/72   Wt Readings from Last 3 Encounters:  03/14/18 212 lb (96.2 kg)  03/09/17 202 lb 6.4 oz (91.8 kg)  12/15/15 202 lb 6.4 oz (91.8 kg)    Physical Exam  Constitutional: No distress.  HENT:  Mouth/Throat: Oropharynx is clear and moist.  Eyes: Pupils are equal, round, and reactive to light. Conjunctivae are normal.  Neck: Neck supple.  Cardiovascular: Normal rate, regular rhythm and normal heart sounds.  Pulmonary/Chest: Effort normal and breath sounds normal.  Abdominal: Soft. Bowel sounds are normal. He exhibits no distension. There is no tenderness.  Musculoskeletal: He exhibits no edema.  Lymphadenopathy:    He has no cervical adenopathy.  Neurological: He is alert.  Skin: Skin is warm and dry. He is not diaphoretic.  Psychiatric: He has a normal mood and affect.     Assessment/Plan:   Routine general medical examination at a health care facility Physical exam completed.  Encouraged continued exercise and monitoring his diet.  Lab work as outlined below.  Encouraged him to see an optometrist to check his vision.  Patient's blood pressure is on the borderline of being elevated.  He will start checking it at home and he will contact us in 2 weeks with numbers.  Symptom of feeling like he cannot get a deep breath when he has not been exercising could be related to deconditioning.   Orders Placed This  Encounter  Procedures  . Lipid panel  . HgB A1c  . Comp Met (CMET)    No orders of the defined types were placed in this encounter.    Philip Rumps, MD [place]

## 2019-03-14 ENCOUNTER — Other Ambulatory Visit: Payer: Self-pay

## 2019-03-20 ENCOUNTER — Other Ambulatory Visit: Payer: Self-pay

## 2019-03-20 ENCOUNTER — Ambulatory Visit (INDEPENDENT_AMBULATORY_CARE_PROVIDER_SITE_OTHER): Payer: Managed Care, Other (non HMO) | Admitting: Family Medicine

## 2019-03-20 ENCOUNTER — Ambulatory Visit (INDEPENDENT_AMBULATORY_CARE_PROVIDER_SITE_OTHER): Payer: Managed Care, Other (non HMO)

## 2019-03-20 ENCOUNTER — Encounter: Payer: Self-pay | Admitting: Family Medicine

## 2019-03-20 VITALS — BP 124/80 | HR 97 | Temp 98.5°F | Ht 72.0 in | Wt 212.0 lb

## 2019-03-20 DIAGNOSIS — Z0001 Encounter for general adult medical examination with abnormal findings: Secondary | ICD-10-CM | POA: Diagnosis not present

## 2019-03-20 DIAGNOSIS — E785 Hyperlipidemia, unspecified: Secondary | ICD-10-CM

## 2019-03-20 DIAGNOSIS — R229 Localized swelling, mass and lump, unspecified: Secondary | ICD-10-CM | POA: Insufficient documentation

## 2019-03-20 DIAGNOSIS — E663 Overweight: Secondary | ICD-10-CM | POA: Diagnosis not present

## 2019-03-20 DIAGNOSIS — R0789 Other chest pain: Secondary | ICD-10-CM

## 2019-03-20 DIAGNOSIS — H539 Unspecified visual disturbance: Secondary | ICD-10-CM | POA: Diagnosis not present

## 2019-03-20 LAB — CBC
HCT: 47.1 % (ref 39.0–52.0)
Hemoglobin: 16 g/dL (ref 13.0–17.0)
MCHC: 34 g/dL (ref 30.0–36.0)
MCV: 90.9 fl (ref 78.0–100.0)
Platelets: 249 10*3/uL (ref 150.0–400.0)
RBC: 5.18 Mil/uL (ref 4.22–5.81)
RDW: 13.7 % (ref 11.5–15.5)
WBC: 6.8 10*3/uL (ref 4.0–10.5)

## 2019-03-20 LAB — COMPREHENSIVE METABOLIC PANEL
ALT: 28 U/L (ref 0–53)
AST: 21 U/L (ref 0–37)
Albumin: 4.7 g/dL (ref 3.5–5.2)
Alkaline Phosphatase: 65 U/L (ref 39–117)
BUN: 15 mg/dL (ref 6–23)
CO2: 26 mEq/L (ref 19–32)
Calcium: 9.7 mg/dL (ref 8.4–10.5)
Chloride: 103 mEq/L (ref 96–112)
Creatinine, Ser: 1.02 mg/dL (ref 0.40–1.50)
GFR: 78.68 mL/min (ref >60.00)
Glucose, Bld: 104 mg/dL — ABNORMAL HIGH (ref 70–99)
Potassium: 4.3 mEq/L (ref 3.5–5.1)
Sodium: 137 mEq/L (ref 135–145)
Total Bilirubin: 0.8 mg/dL (ref 0.2–1.2)
Total Protein: 7.4 g/dL (ref 6.0–8.3)

## 2019-03-20 LAB — LIPID PANEL
Cholesterol: 221 mg/dL — ABNORMAL HIGH (ref 0–200)
HDL: 28.1 mg/dL — ABNORMAL LOW (ref >39.00)
Total CHOL/HDL Ratio: 8
Triglycerides: 431 mg/dL — ABNORMAL HIGH (ref 0.0–149.0)

## 2019-03-20 LAB — HEMOGLOBIN A1C: Hgb A1c MFr Bld: 5.3 % (ref 4.6–6.5)

## 2019-03-20 LAB — LDL CHOLESTEROL, DIRECT: Direct LDL: 141 mg/dL

## 2019-03-20 LAB — TSH: TSH: 2.32 u[IU]/mL (ref 0.35–4.50)

## 2019-03-20 NOTE — Assessment & Plan Note (Signed)
Potentially could be a lipoma though given location it could represent a lymph node.  Will obtain an ultrasound to evaluate further.

## 2019-03-20 NOTE — Progress Notes (Signed)
See other documentation

## 2019-03-20 NOTE — Assessment & Plan Note (Signed)
Physical exam completed.  Encouraged continued monitoring of his diet though decreasing fast food intake.  Also discussed increasing exercise again.  Vaccinations up-to-date.  Lab work as outlined below.

## 2019-03-20 NOTE — Assessment & Plan Note (Signed)
Patient with atypical chest pain.  Does not seem consistent with a cardiac cause.  EKG is reassuring.  Will obtain a chest x-ray.  Could potentially be reflux or musculoskeletal.  Given return precautions.

## 2019-03-20 NOTE — Assessment & Plan Note (Signed)
Issues with reading and associated headaches after trying to read or look at a computer screen.  Vision screening acceptable.  Advised seeing ophthalmology for evaluation.  Referral placed.

## 2019-03-20 NOTE — Patient Instructions (Signed)
Nice to see you. Please try to increase your exercise and decrease your fast food intake. We will refer you to ophthalmology. We will contact you with your lab results and x-ray. We will get you scheduled for an ultrasound of your neck as well. If you develop persistent chest pain or chest pressure or develop any new symptoms please be evaluated.

## 2019-03-20 NOTE — Progress Notes (Signed)
Philip Rumps, MD Phone: [phone]  Philip Reynolds is a 48 y.o. male who presents today for CPE.  Exercise has been put on hold.  Typically was going to the gym though COVID-19 pandemic has interfered with that.   Diet is fairly healthy.  He drinks a protein shake in the morning.  Fast food at lunch.  Healthy dinner with vegetables and meat. No family history of prostate cancer or colon cancer. He is sexually active with one partner who is his wife. Tetanus vaccine up-to-date. No tobacco use or illicit drug use.  He drinks 0-07 alcoholic beverages weekly. He sees a Pharmacist, community and an ophthalmologist.  Vision changes: Patient notes he has had some vision changes where it is difficult to read.  He has tried over-the-counter reading glasses though he feels as though they are not working all that well and he gets a little bit of a headache when he strains to read.  The headache is frontal in nature.  This has been going on intermittently for 6 months and improves with decreased screen time and decreased reading.  This does not wake him from sleep.  He has had chronic mild lightheadedness with his vision issues.    Chest pain: Patient notes random left-sided sharp chest discomfort that has been going on intermittently for 6 to 8 months.  Oftentimes will occur when he goes to bed.  Does not occur with activity.  No heartburn symptoms.  No chest pressure, tightness, radiation, diaphoresis, or shortness of breath.  No family history of heart disease.  Subcutaneous nodule: He has a nodule on his right posterior neck that has been there for about a year or so.  It may be getting slightly larger.  He does have a history of lipomas though he is unsure exactly what this is.  Active Ambulatory Problems    Diagnosis Date Noted  . CARCINOMA, BASAL CELL, BACK 05/28/2009  . HLD (hyperlipidemia) 09/30/2008  . Lipoma of skin and subcutaneous tissue 05/18/2011  . Encounter for general adult medical  examination with abnormal findings 07/18/2013  . Herpes infection 10/10/2014  . Nevus 03/09/2017  . Atypical chest pain 03/20/2019  . Subcutaneous nodule 03/20/2019  . Vision changes 03/20/2019   Resolved Ambulatory Problems    Diagnosis Date Noted  . UMBILICAL HERNIA 62/26/3335  . Right inguinal hernia 07/18/2013  . Umbilical hernia 45/62/5638   Past Medical History:  Diagnosis Date  . Basal cell carcinoma of back   . Herpetic whitlow   . Other and unspecified hyperlipidemia     Family History  Problem Relation Age of Onset  . Diabetes Father   . Stroke Father        Post radiation for brain tumor  . Cancer Father        Brain tumor  . Depression Father   . Kidney disease Neg Hx   . Alcohol abuse Neg Hx   . Drug abuse Neg Hx   . CAD Neg Hx     Social History   Socioeconomic History  . Marital status: Married    Spouse name: Not on file  . Number of children: 0  . Years of education: Not on file  . Highest education level: Not on file  Occupational History  . 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 Financial resource strain: Not on file  . Food insecurity    Worry: Not on file    Inability: Not on file  .  Transportation needs    Medical: Not on file    Non-medical: Not on file  Tobacco Use  . Smoking status: Never Smoker  . Smokeless tobacco: Never Used  Substance and Sexual Activity  . Alcohol use: Yes    Comment: Occasional  . Drug use: No  . Sexual activity: Not on file  Lifestyle  . Physical activity    Days per week: Not on file    Minutes per session: Not on file  . Stress: Not on file  Relationships  . Social Herbalist on phone: Not on file    Gets together: Not on file    Attends religious service: Not on file    Active member of club or organization: Not on file    Attends meetings of clubs or organizations: Not on file    Relationship status: Not on file  . Intimate partner violence     Fear of current or ex partner: Not on file    Emotionally abused: Not on file    Physically abused: Not on file    Forced sexual activity: Not on file  Other Topics Concern  . Not on file  Social History Narrative   Lives with wife Philip Reynolds   Occ: Warehouse manager business.   Activity: regular gym 5d /wk   Diet: tries to eat healthy, avoids fast food, good water, fruits/vegetables daily    ROS  General:  Negative for nexplained weight loss, fever Skin: Negative for new or changing mole, sore that won't heal HEENT: Positive for trouble seeing, negative for trouble hearing, ringing in ears, mouth sores, hoarseness, change in voice, dysphagia. CV: Positive for chest pain, negative for dyspnea, edema, palpitations Resp: Negative for cough, dyspnea, hemoptysis GI: Negative for nausea, vomiting, diarrhea, constipation, abdominal pain, melena, hematochezia. GU: Negative for dysuria, incontinence, urinary hesitance, hematuria, vaginal or penile discharge, polyuria, sexual difficulty, lumps in testicle or breasts MSK: Negative for muscle cramps or aches, joint pain or swelling Neuro: Positive for headaches, negative for weakness, numbness, passing out/fainting Psych: Negative for depression, anxiety, memory problems  Objective  Physical Exam Vitals:   03/20/19 0808  BP: 124/80  Pulse: 97  Temp: 98.5 F (36.9 C)  SpO2: 97%    BP Readings from Last 3 Encounters:  03/20/19 124/80  03/14/18 130/90  03/09/17 118/90   Wt Readings from Last 3 Encounters:  03/20/19 212 lb (96.2 kg)  03/14/18 212 lb (96.2 kg)  03/09/17 202 lb 6.4 oz (91.8 kg)    Physical Exam Constitutional:      General: He is not in acute distress.    Appearance: He is not diaphoretic.  HENT:     Head: Normocephalic and atraumatic.  Eyes:     Conjunctiva/sclera: Conjunctivae normal.     Pupils: Pupils are equal, round, and reactive to light.  Neck:   Cardiovascular:     Rate and Rhythm:  Normal rate and regular rhythm.     Heart sounds: Normal heart sounds.  Pulmonary:     Effort: Pulmonary effort is normal.     Breath sounds: Normal breath sounds.  Abdominal:     General: Bowel sounds are normal. There is no distension.     Palpations: Abdomen is soft.     Tenderness: There is no abdominal tenderness. There is no guarding or rebound.  Musculoskeletal:     Right lower leg: No edema.     Left lower leg: No edema.  Lymphadenopathy:  Head:     Right side of head: No submental, submandibular, posterior auricular or occipital adenopathy.     Left side of head: No submental, submandibular, posterior auricular or occipital adenopathy.     Cervical: No cervical adenopathy.  Skin:    General: Skin is warm and dry.  Neurological:     Mental Status: He is alert.     Comments: Moves all extremities equally, stands from chair and gets on the table easily  Psychiatric:        Mood and Affect: Mood normal.    EKG: Normal sinus rhythm, rate 66, no ischemic changes  Assessment/Plan:   Encounter for general adult medical examination with abnormal findings Physical exam completed.  Encouraged continued monitoring of his diet though decreasing fast food intake.  Also discussed increasing exercise again.  Vaccinations up-to-date.  Lab work as outlined below.  Atypical chest pain Patient with atypical chest pain.  Does not seem consistent with a cardiac cause.  EKG is reassuring.  Will obtain a chest x-ray.  Could potentially be reflux or musculoskeletal.  Given return precautions.  Subcutaneous nodule Potentially could be a lipoma though given location it could represent a lymph node.  Will obtain an ultrasound to evaluate further.  Vision changes Issues with reading and associated headaches after trying to read or look at a computer screen.  Vision screening acceptable.  Advised seeing ophthalmology for evaluation.  Referral placed.   Orders Placed This Encounter   Procedures  . DG Chest 2 View    Standing Status:   Future    Number of Occurrences:   1    Standing Expiration Date:   05/20/2020    Order Specific Question:   Reason for Exam (SYMPTOM  OR DIAGNOSIS REQUIRED)    Answer:   atypical left sided chest pain    Order Specific Question:   Preferred imaging location?    Answer:   [facility] Specific Question:   Radiology Contrast Protocol - do NOT remove file path    Answer:   [email or web address]  . US Soft Tissue Head/Neck    Standing Status:   Future    Standing Expiration Date:   05/20/2020    Order Specific Question:   Reason for Exam (SYMPTOM  OR DIAGNOSIS REQUIRED)    Answer:   right posterior neck subcutaneous nodule, present for close to a year, enlarging slowly    Order Specific Question:   Preferred imaging location?    Answer:   [facility]  . Comp Met (CMET)  . Hemoglobin A1c  . CBC  . TSH  . Lipid panel  . Ambulatory referral to Ophthalmology    Referral Priority:   Routine    Referral Type:   Consultation    Referral Reason:   Specialty Services Required    Requested Specialty:   Ophthalmology    Number of Visits Requested:   1  . EKG 12-Lead    No orders of the defined types were placed in this encounter.    Philip Rumps, MD [place]

## 2019-03-29 ENCOUNTER — Ambulatory Visit
Admission: RE | Admit: 2019-03-29 | Discharge: 2019-03-29 | Disposition: A | Discharge status: Home / Self Care | Payer: Managed Care, Other (non HMO) | Source: Ambulatory Visit | Attending: Family Medicine | Admitting: Family Medicine

## 2019-03-29 ENCOUNTER — Encounter: Payer: Self-pay | Admitting: *Deleted

## 2019-03-29 ENCOUNTER — Other Ambulatory Visit: Payer: Self-pay

## 2019-03-29 DIAGNOSIS — R229 Localized swelling, mass and lump, unspecified: Secondary | ICD-10-CM | POA: Diagnosis present

## 2019-05-07 ENCOUNTER — Encounter: Payer: Self-pay | Admitting: *Deleted

## 2019-07-29 ENCOUNTER — Encounter: Payer: Self-pay | Admitting: Family Medicine

## 2019-07-29 DIAGNOSIS — R221 Localized swelling, mass and lump, neck: Secondary | ICD-10-CM

## 2019-08-27 ENCOUNTER — Ambulatory Visit: Admission: RE | Admit: 2019-08-27 | Payer: Managed Care, Other (non HMO) | Source: Ambulatory Visit

## 2019-09-02 DIAGNOSIS — Z03818 Encounter for observation for suspected exposure to other biological agents ruled out: Secondary | ICD-10-CM | POA: Diagnosis not present

## 2019-09-02 DIAGNOSIS — U071 COVID-19: Secondary | ICD-10-CM | POA: Diagnosis not present

## 2019-09-18 ENCOUNTER — Telehealth: Payer: Self-pay | Admitting: Family Medicine

## 2019-09-18 DIAGNOSIS — E785 Hyperlipidemia, unspecified: Secondary | ICD-10-CM

## 2019-09-18 DIAGNOSIS — R6882 Decreased libido: Secondary | ICD-10-CM

## 2019-09-18 NOTE — Telephone Encounter (Signed)
Called pt to reschedule appt from next week. Pt states that Dr Caryl Bis would not want him to wait to be seen. Please advise.

## 2019-09-18 NOTE — Telephone Encounter (Signed)
Patient is requesting labs and he wants you to add on testosterone to see if he is low.  Kenton Fortin,cma

## 2019-09-19 NOTE — Telephone Encounter (Signed)
Is there a particular reason that he wants the testosterone done other than to see if he is low?  I have to have a medical diagnosis to order this lab.  Depending on why he wants this done his insurance may not pay for it.

## 2019-09-20 NOTE — Telephone Encounter (Signed)
I called nad spoke with the patient and he states he has a low sex drive and no energy at all and that's why he wants to see if it is low.  Erminie Foulks,cma

## 2019-09-22 NOTE — Telephone Encounter (Signed)
Labs ordered. He needs to have them completed prior to 10 am due to the testosterone check. Thanks.

## 2019-09-22 NOTE — Addendum Note (Signed)
Addended by: Caryl Bis, Rex Magee G on: 09/22/2019 11:01 AM   Modules accepted: Orders

## 2019-09-23 NOTE — Telephone Encounter (Signed)
Called and scheduled the patient for labs before 10 am. Roshan Roback,cma

## 2019-09-25 ENCOUNTER — Other Ambulatory Visit (INDEPENDENT_AMBULATORY_CARE_PROVIDER_SITE_OTHER): Payer: BC Managed Care – PPO

## 2019-09-25 ENCOUNTER — Ambulatory Visit: Payer: Managed Care, Other (non HMO) | Admitting: Family Medicine

## 2019-09-25 ENCOUNTER — Other Ambulatory Visit: Payer: Managed Care, Other (non HMO)

## 2019-09-25 ENCOUNTER — Other Ambulatory Visit: Payer: Self-pay

## 2019-09-25 DIAGNOSIS — E785 Hyperlipidemia, unspecified: Secondary | ICD-10-CM

## 2019-09-25 DIAGNOSIS — R6882 Decreased libido: Secondary | ICD-10-CM

## 2019-09-25 LAB — TESTOSTERONE: Testosterone: 410.71 ng/dL (ref 300.00–890.00)

## 2019-09-25 LAB — LIPID PANEL
Cholesterol: 237 mg/dL — ABNORMAL HIGH (ref 0–200)
HDL: 34.9 mg/dL — ABNORMAL LOW (ref >39.00)
NonHDL: 202.54
Total CHOL/HDL Ratio: 7
Triglycerides: 346 mg/dL — ABNORMAL HIGH (ref 0.0–149.0)
VLDL: 69.2 mg/dL — ABNORMAL HIGH (ref 0.0–40.0)

## 2019-09-25 LAB — LDL CHOLESTEROL, DIRECT: Direct LDL: 156 mg/dL

## 2019-10-09 ENCOUNTER — Encounter: Payer: Self-pay | Admitting: Family Medicine

## 2019-10-13 ENCOUNTER — Other Ambulatory Visit: Payer: Self-pay | Admitting: Family Medicine

## 2019-10-13 DIAGNOSIS — E782 Mixed hyperlipidemia: Secondary | ICD-10-CM

## 2019-10-13 MED ORDER — ROSUVASTATIN CALCIUM 20 MG PO TABS: 20.0000 mg | ORAL_TABLET | Freq: Every day | ORAL | 3 refills | Status: DC

## 2019-10-14 ENCOUNTER — Telehealth: Payer: Self-pay

## 2019-10-14 NOTE — Telephone Encounter (Signed)
-----   Message from Leone Haven, MD sent at 10/13/2019 12:31 PM EST ----- Crestor sent to pharmacy. He will need labs completed 6 weeks after starting crestor. Orders placed. Mychart message sent to patient regarding medication for sex drive.

## 2019-10-30 ENCOUNTER — Ambulatory Visit (INDEPENDENT_AMBULATORY_CARE_PROVIDER_SITE_OTHER): Payer: BC Managed Care – PPO | Admitting: Family Medicine

## 2019-10-30 ENCOUNTER — Other Ambulatory Visit: Payer: Self-pay

## 2019-10-30 ENCOUNTER — Encounter: Payer: Self-pay | Admitting: Family Medicine

## 2019-10-30 DIAGNOSIS — R069 Unspecified abnormalities of breathing: Secondary | ICD-10-CM | POA: Insufficient documentation

## 2019-10-30 DIAGNOSIS — N529 Male erectile dysfunction, unspecified: Secondary | ICD-10-CM

## 2019-10-30 DIAGNOSIS — R229 Localized swelling, mass and lump, unspecified: Secondary | ICD-10-CM

## 2019-10-30 DIAGNOSIS — E785 Hyperlipidemia, unspecified: Secondary | ICD-10-CM

## 2019-10-30 MED ORDER — SILDENAFIL CITRATE 50 MG PO TABS: 50.0000 mg | ORAL_TABLET | Freq: Every day | ORAL | 0 refills | Status: DC | PRN

## 2019-10-30 NOTE — Progress Notes (Signed)
[name], MD Phone: [phone]  Philip Reynolds is a 49 y.o. male who presents today for follow-up.  Hyperlipidemia: He did not pick up the Crestor.  No chest pain.  Current ASCVD risk percentage does not place him in a category that requires medication.  Breathing abnormality: Patient notes for quite some time he has had issues where he feels as though he cannot take a deep breath.  Notes it seems to get better when he does heavy cardio workouts.  No shortness of breath.  No pain with breathing.  No wheezing.  No cough.  No tobacco use.  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  Erectile dysfunction: Patient notes he has issues with maintaining and getting erections.  They are not as strong as they used to be.  He has less interest.  No depression or anxiety.  No nighttime erections.  No pain with erections.  Subcutaneous nodule: Patient had this evaluated with an ultrasound previously.  It was an indeterminate finding and a CT scan was recommended though he did not go through with that given the cost.  The area has been stable for several years.  There is no associated pain.  Social History   Tobacco Use  Smoking Status Never Smoker  Smokeless Tobacco Never Used     ROS see history of present illness  Objective  Physical Exam Vitals:   10/30/19 1015  BP: 120/79  Pulse: 71  Temp: (!) 97.4 F (36.3 C)  SpO2: 98%    BP Readings from Last 3 Encounters:  10/30/19 120/79  03/20/19 124/80  03/14/18 130/90   Wt Readings from Last 3 Encounters:  10/30/19 207 lb 9.6 oz (94.2 kg)  03/20/19 212 lb (96.2 kg)  03/14/18 212 lb (96.2 kg)    Physical Exam Constitutional:      General: He is not in acute distress.    Appearance: He is not diaphoretic.  Neck:   Cardiovascular:     Rate and Rhythm: Normal rate and regular rhythm.     Heart sounds: Normal heart sounds.  Pulmonary:     Effort: Pulmonary effort is normal.     Breath  sounds: Normal breath sounds.  Genitourinary:    Penis: Normal.      Testes: Normal.     Comments: Normal scrotum, normal vas deferens, normal epididymis, no inguinal hernia Skin:    General: Skin is warm and dry.  Neurological:     Mental Status: He is alert.      Assessment/Plan: Please see individual problem list.  Abnormal breathing PFTs ordered.  Advised that he should check with his insurance regarding coverage and when they call to schedule he should check on price given his concern for cost.  Subcutaneous nodule Indeterminate on ultrasound.  Discussed the options are completing a CT scan or sending him to general surgery for further evaluation.  He wonders about monitoring given that it has not changed.  I discussed that would be an option though there is a risk that we could be missing a significant underlying lesion such as a cancer.  He opted to monitor and he will let us know immediately if there are any changes or he notices any new symptoms.  HLD (hyperlipidemia) Remains elevated.  His ASCVD risk score does not require medication at this time.  He will work on diet and exercise.  Plan to recheck in 6 months.  Erectile dysfunction Benign  exam.  Suspect related to blood flow issues.  Trial of Viagra recommended.  If not improving advised that we would refer him to urology.  Discussed that if he ever develop chest pain or shortness of breath during intercourse he should stop and seek medical attention.  Advised that if he ever has to call 911 or go to the emergency room he needs to let them know if he has taken the Viagra anytime in the past couple of days.  Also advised that if he develops an erection that lasts longer than 4 hours he should seek medical attention.    Orders Placed This Encounter  Procedures  . Pulmonary Function Test [facility] Only    Standing Status:   Future    Standing Expiration Date:   10/29/2020    Order Specific Question:   Full PFT: includes the  following: basic spirometry, spirometry pre & post bronchodilator, diffusion capacity (DLCO), lung volumes    Answer:   Full PFT    Order Specific Question:   This test can only be performed at    Answer:   [facility] Specific Question:   Release to patient    Answer:   Immediate    Meds ordered this encounter  Medications  . sildenafil (VIAGRA) 50 MG tablet    Sig: Take 1 tablet (50 mg total) by mouth daily as needed for erectile dysfunction.    Dispense:  10 tablet    Refill:  0   The 10-year ASCVD risk score Mikey Bussing DC Jr., et al., 2013) is: 4.1%   Values used to calculate the score:     Age: 89 years     Sex: Male     Is Non-Hispanic African American: No     Diabetic: No     Tobacco smoker: No     Systolic Blood Pressure: 123456 mmHg     Is BP treated: No     HDL Cholesterol: 34.9 mg/dL     Total Cholesterol: 237 mg/dL    This visit occurred during the SARS-CoV-2 public health emergency.  Safety protocols were in place, including screening questions prior to the visit, additional usage of staff PPE, and extensive cleaning of exam room while observing appropriate contact time as indicated for disinfecting solutions.    [name], MD [place]

## 2019-10-30 NOTE — Assessment & Plan Note (Signed)
Remains elevated.  His ASCVD risk score does not require medication at this time.  He will work on diet and exercise.  Plan to recheck in 6 months.

## 2019-10-30 NOTE — Patient Instructions (Addendum)
Nice to see you. You do not need the Crestor at this time.  Please work on diet and exercise. We will get PFTs completed for you for abnormal breathing. Diagnosis code R06.9. Please try the Viagra for your erectile dysfunction.  If it is beneficial please let us know.  If you have an erection for longer than 4 hours please seek medical attention.  If you ever develop chest pain or shortness of breath during intercourse please stop and seek medical attention.  If you ever have to call 911 or go to the emergency department you need to let them know that you have been taking this medication.

## 2019-10-30 NOTE — Assessment & Plan Note (Signed)
Benign exam.  Suspect related to blood flow issues.  Trial of Viagra recommended.  If not improving advised that we would refer him to urology.  Discussed that if he ever develop chest pain or shortness of breath during intercourse he should stop and seek medical attention.  Advised that if he ever has to call 911 or go to the emergency room he needs to let them know if he has taken the Viagra anytime in the past couple of days.  Also advised that if he develops an erection that lasts longer than 4 hours he should seek medical attention.

## 2019-10-30 NOTE — Assessment & Plan Note (Signed)
Indeterminate on ultrasound.  Discussed the options are completing a CT scan or sending him to general surgery for further evaluation.  He wonders about monitoring given that it has not changed.  I discussed that would be an option though there is a risk that we could be missing a significant underlying lesion such as a cancer.  He opted to monitor and he will let us know immediately if there are any changes or he notices any new symptoms.

## 2019-10-30 NOTE — Assessment & Plan Note (Signed)
PFTs ordered.  Advised that he should check with his insurance regarding coverage and when they call to schedule he should check on price given his concern for cost.

## 2019-11-26 ENCOUNTER — Encounter: Payer: Self-pay | Admitting: Family Medicine

## 2019-11-26 MED ORDER — SILDENAFIL CITRATE 50 MG PO TABS: 50.0000 mg | ORAL_TABLET | Freq: Every day | ORAL | 0 refills | Status: DC | PRN

## 2019-11-30 MED ORDER — SILDENAFIL CITRATE 50 MG PO TABS: 50.0000 mg | ORAL_TABLET | Freq: Every day | ORAL | 0 refills | Status: DC | PRN

## 2019-11-30 NOTE — Addendum Note (Signed)
Addended by: Leone Haven on: 11/30/2019 09:34 AM   Modules accepted: Orders

## 2019-12-05 ENCOUNTER — Other Ambulatory Visit: Payer: BC Managed Care – PPO

## 2019-12-10 ENCOUNTER — Telehealth: Payer: Self-pay | Admitting: Family Medicine

## 2019-12-10 NOTE — Telephone Encounter (Signed)
I called pt to get pt scheduled for the PFT. Pt was scheduled but due to having to have a covid test pt appt was cancelled due to not wanting to have the covid test.

## 2019-12-10 NOTE — Telephone Encounter (Signed)
Noted  

## 2019-12-27 ENCOUNTER — Ambulatory Visit: Payer: BC Managed Care – PPO

## 2020-02-03 ENCOUNTER — Other Ambulatory Visit: Payer: Self-pay | Admitting: Family Medicine

## 2020-02-04 MED ORDER — SILDENAFIL CITRATE 50 MG PO TABS: 50.0000 mg | ORAL_TABLET | Freq: Every day | ORAL | 0 refills | Status: DC | PRN

## 2020-04-04 ENCOUNTER — Other Ambulatory Visit: Payer: Self-pay | Admitting: Family Medicine

## 2020-04-06 MED ORDER — SILDENAFIL CITRATE 50 MG PO TABS: 50.0000 mg | ORAL_TABLET | Freq: Every day | ORAL | 1 refills | Status: DC | PRN

## 2020-04-16 DIAGNOSIS — Z20822 Contact with and (suspected) exposure to covid-19: Secondary | ICD-10-CM | POA: Diagnosis not present

## 2020-05-06 ENCOUNTER — Encounter: Payer: BC Managed Care – PPO | Admitting: Family Medicine

## 2020-05-18 ENCOUNTER — Encounter: Payer: Self-pay | Admitting: Family Medicine

## 2020-05-18 ENCOUNTER — Other Ambulatory Visit: Payer: Self-pay

## 2020-05-18 ENCOUNTER — Ambulatory Visit (INDEPENDENT_AMBULATORY_CARE_PROVIDER_SITE_OTHER): Payer: BC Managed Care – PPO

## 2020-05-18 ENCOUNTER — Ambulatory Visit (INDEPENDENT_AMBULATORY_CARE_PROVIDER_SITE_OTHER): Payer: BC Managed Care – PPO | Admitting: Family Medicine

## 2020-05-18 VITALS — BP 120/80 | HR 73 | Temp 97.9°F | Ht 72.0 in | Wt 207.6 lb

## 2020-05-18 DIAGNOSIS — Z1211 Encounter for screening for malignant neoplasm of colon: Secondary | ICD-10-CM

## 2020-05-18 DIAGNOSIS — E785 Hyperlipidemia, unspecified: Secondary | ICD-10-CM

## 2020-05-18 DIAGNOSIS — E663 Overweight: Secondary | ICD-10-CM

## 2020-05-18 DIAGNOSIS — M19042 Primary osteoarthritis, left hand: Secondary | ICD-10-CM | POA: Diagnosis not present

## 2020-05-18 DIAGNOSIS — M79642 Pain in left hand: Secondary | ICD-10-CM | POA: Insufficient documentation

## 2020-05-18 DIAGNOSIS — Z8616 Personal history of COVID-19: Secondary | ICD-10-CM

## 2020-05-18 DIAGNOSIS — H539 Unspecified visual disturbance: Secondary | ICD-10-CM

## 2020-05-18 DIAGNOSIS — Z0001 Encounter for general adult medical examination with abnormal findings: Secondary | ICD-10-CM

## 2020-05-18 LAB — COMPREHENSIVE METABOLIC PANEL
ALT: 20 U/L (ref 0–53)
AST: 17 U/L (ref 0–37)
Albumin: 4.5 g/dL (ref 3.5–5.2)
Alkaline Phosphatase: 68 U/L (ref 39–117)
BUN: 20 mg/dL (ref 6–23)
CO2: 25 mEq/L (ref 19–32)
Calcium: 9.3 mg/dL (ref 8.4–10.5)
Chloride: 105 mEq/L (ref 96–112)
Creatinine, Ser: 0.88 mg/dL (ref 0.40–1.50)
GFR: 92.82 mL/min (ref >60.00)
Glucose, Bld: 102 mg/dL — ABNORMAL HIGH (ref 70–99)
Potassium: 3.9 mEq/L (ref 3.5–5.1)
Sodium: 138 mEq/L (ref 135–145)
Total Bilirubin: 0.8 mg/dL (ref 0.2–1.2)
Total Protein: 7.2 g/dL (ref 6.0–8.3)

## 2020-05-18 LAB — HEMOGLOBIN A1C: Hgb A1c MFr Bld: 5.4 % (ref 4.6–6.5)

## 2020-05-18 LAB — SARS-COV-2 IGG: SARS-COV-2 IgG: 0.68

## 2020-05-18 LAB — LIPID PANEL
Cholesterol: 208 mg/dL — ABNORMAL HIGH (ref 0–200)
HDL: 31.9 mg/dL — ABNORMAL LOW (ref >39.00)
LDL Cholesterol: 137 mg/dL — ABNORMAL HIGH (ref 0–99)
NonHDL: 176.18
Total CHOL/HDL Ratio: 7
Triglycerides: 196 mg/dL — ABNORMAL HIGH (ref 0.0–149.0)
VLDL: 39.2 mg/dL (ref 0.0–40.0)

## 2020-05-18 NOTE — Assessment & Plan Note (Signed)
Physical exam completed.  Encouraged continued healthy diet.  Discussed continuing to cut down on sodas.  Discussed remaining active and exercising.  He will get his flu vaccine through work.  He will consider getting the Covid vaccine after we get his Covid antibody test back.  Discussed not increasing his alcohol consumption and certainly limiting it could prevent liver issues in the future.  Referred to GI for colonoscopy.  Lab work as outlined below.

## 2020-05-18 NOTE — Progress Notes (Signed)
[name], MD Phone: [phone]  Philip Reynolds is a 49 y.o. male who presents today for CPE.  Diet: Generally healthy.  Lots of salads.  Beef 1 time a week.  Not many carbs.  He is cut down soda from 4-5 a day to 1/day. Exercise: Generally active with work.  Also exercises though has not done so much the last several months. Colonoscopy: Due Family history-  Prostate cancer: No  Colon cancer: no Vaccines-   Flu: He will get this through work  Tetanus: Up-to-date  COVID19: He is considering getting this like his Covid antibodies checked as he had Covid earlier this year. HIV screening: Has donated blood previously Hep C Screening: Notes testing about 15 years ago though also notes he donated blood in the past as well. Tobacco use: No Alcohol use: About 10 beverages per week Illicit Drug use: No Dentist: Yes Ophthalmology: Yes, notes he started wearing reading glasses.  Notes his vision and eyes checked out okay when he saw the eye doctor previously.  Left hand pain: Patient notes he was walking a horse and the horse pulled away after being stung by a horse fly and it pulled on his left hand in his third through fifth fingers.  He had quite a bit of pain initially though the pain has been improving.  His movement is coming back and then.  He does note there is some stiffness.  He does have pain over his fifth metacarpal.    Active Ambulatory Problems    Diagnosis Date Noted  . CARCINOMA, BASAL CELL, BACK 05/28/2009  . HLD (hyperlipidemia) 09/30/2008  . Lipoma of skin and subcutaneous tissue 05/18/2011  . Encounter for general adult medical examination with abnormal findings 07/18/2013  . Herpes infection 10/10/2014  . Nevus 03/09/2017  . Subcutaneous nodule 03/20/2019  . Vision changes 03/20/2019  . Abnormal breathing 10/30/2019  . Erectile dysfunction 10/30/2019  . Left hand pain 05/18/2020   Resolved Ambulatory Problems    Diagnosis Date Noted  . UMBILICAL  HERNIA 95/18/8416  . Right inguinal hernia 07/18/2013  . Umbilical hernia 60/63/0160  . Atypical chest pain 03/20/2019   Past Medical History:  Diagnosis Date  . Basal cell carcinoma of back   . Herpetic whitlow   . Other and unspecified hyperlipidemia     Family History  Problem Relation Age of Onset  . Diabetes Father   . Stroke Father        Post radiation for brain tumor  . Cancer Father        Brain tumor  . Depression Father   . Kidney disease Neg Hx   . Alcohol abuse Neg Hx   . Drug abuse Neg Hx   . CAD Neg Hx     Social History   Socioeconomic History  . Marital status: Married    Spouse name: Not on file  . Number of children: 0  . Years of education: Not on file  . Highest education level: Not on file  Occupational History  . Occupation: Self owned Dealer business    Comment: 04/2005  Tobacco Use  . Smoking status: Never Smoker  . Smokeless tobacco: Never Used  Substance and Sexual Activity  . Alcohol use: Yes    Comment: Occasional  . Drug use: No  . Sexual activity: Not on file  Other Topics Concern  . Not on file  Social History Narrative   Lives with wife Philip Reynolds   Occ: Warehouse manager business.  Activity: regular gym 5d /wk   Diet: tries to eat healthy, avoids fast food, good water, fruits/vegetables daily   Social Determinants of Health   Financial Resource Strain:   . Difficulty of Paying Living Expenses: Not on file  Food Insecurity:   . Worried About Charity fundraiser in the Last Year: Not on file  . Ran Out of Food in the Last Year: Not on file  Transportation Needs:   . Lack of Transportation (Medical): Not on file  . Lack of Transportation (Non-Medical): Not on file  Physical Activity:   . Days of Exercise per Week: Not on file  . Minutes of Exercise per Session: Not on file  Stress:   . Feeling of Stress : Not on file  Social Connections:   . Frequency of Communication with Friends and Family: Not on file    . Frequency of Social Gatherings with Friends and Family: Not on file  . Attends Religious Services: Not on file  . Active Member of Clubs or Organizations: Not on file  . Attends Archivist Meetings: Not on file  . Marital Status: Not on file  Intimate Partner Violence:   . Fear of Current or Ex-Partner: Not on file  . Emotionally Abused: Not on file  . Physically Abused: Not on file  . Sexually Abused: Not on file    ROS  General:  Negative for nexplained weight loss, fever Skin: Negative for new or changing mole, sore that won't heal HEENT: Positive for trouble seeing (near vision), negative for trouble hearing, ringing in ears, mouth sores, hoarseness, change in voice, dysphagia. CV:  Negative for chest pain, dyspnea, edema, palpitations Resp: Negative for cough, dyspnea, hemoptysis GI: Negative for nausea, vomiting, diarrhea, constipation, abdominal pain, melena, hematochezia. GU: Negative for dysuria, incontinence, urinary hesitance, hematuria, vaginal or penile discharge, polyuria, sexual difficulty, lumps in testicle or breasts MSK: Positive for left hand pain, negative for muscle cramps or aches, joint pain or swelling Neuro: Negative for headaches, weakness, numbness, dizziness, passing out/fainting Psych: Negative for depression, anxiety, memory problems  Objective  Physical Exam Vitals:   05/18/20 0827  BP: 120/80  Pulse: 73  Temp: 97.9 F (36.6 C)  SpO2: 97%    BP Readings from Last 3 Encounters:  05/18/20 120/80  10/30/19 120/79  03/20/19 124/80   Wt Readings from Last 3 Encounters:  05/18/20 207 lb 9.6 oz (94.2 kg)  10/30/19 207 lb 9.6 oz (94.2 kg)  03/20/19 212 lb (96.2 kg)    Physical Exam Constitutional:      General: He is not in acute distress.    Appearance: He is not diaphoretic.  HENT:     Head: Normocephalic and atraumatic.  Eyes:     Conjunctiva/sclera: Conjunctivae normal.     Pupils: Pupils are equal, round, and reactive  to light.     Comments: Scatted iris nevi noted  Cardiovascular:     Rate and Rhythm: Normal rate and regular rhythm.     Heart sounds: Normal heart sounds.  Pulmonary:     Effort: Pulmonary effort is normal.     Breath sounds: Normal breath sounds.  Abdominal:     General: Bowel sounds are normal. There is no distension.     Palpations: Abdomen is soft.     Tenderness: There is no abdominal tenderness. There is no rebound.  Musculoskeletal:     Right lower leg: No edema.     Left lower leg: No edema.  Comments: Left hand with slight tenderness over the fifth metacarpal, slight tenderness in his fourth and fifth fingers as well, good capillary refill, sensation intact, 5/5 grip strength in the left hand, slight angulation at the PIP joint in the fourth finger in the left hand  Lymphadenopathy:     Cervical: No cervical adenopathy.  Skin:    General: Skin is warm and dry.  Neurological:     Mental Status: He is alert.  Psychiatric:        Mood and Affect: Mood normal.      Assessment/Plan:   Encounter for general adult medical examination with abnormal findings Physical exam completed.  Encouraged continued healthy diet.  Discussed continuing to cut down on sodas.  Discussed remaining active and exercising.  He will get his flu vaccine through work.  He will consider getting the Covid vaccine after we get his Covid antibody test back.  Discussed not increasing his alcohol consumption and certainly limiting it could prevent liver issues in the future.  Referred to GI for colonoscopy.  Lab work as outlined below.  Left hand pain Likely strained though given persistent discomfort will obtain an x-ray to evaluate for fracture.  Vision changes Stable near vision issues.  Resolved with reading glasses.  Scattered iris nevi noted and he notes he has had these for a very long time without change.  Notes the eye doctor did not comment on them being abnormal.   No orders of the  defined types were placed in this encounter.   No orders of the defined types were placed in this encounter.   This visit occurred during the SARS-CoV-2 public health emergency.  Safety protocols were in place, including screening questions prior to the visit, additional usage of staff PPE, and extensive cleaning of exam room while observing appropriate contact time as indicated for disinfecting solutions.    [name], MD [place]

## 2020-05-18 NOTE — Patient Instructions (Signed)
Nice to see you. We will get labs and an x-ray today.  I will also refer you to GI. Please continue to cut down on sodas.

## 2020-05-18 NOTE — Assessment & Plan Note (Signed)
Stable near vision issues.  Resolved with reading glasses.  Scattered iris nevi noted and he notes he has had these for a very long time without change.  Notes the eye doctor did not comment on them being abnormal.

## 2020-05-18 NOTE — Assessment & Plan Note (Signed)
Likely strained though given persistent discomfort will obtain an x-ray to evaluate for fracture.

## 2020-06-01 ENCOUNTER — Telehealth: Payer: Self-pay

## 2020-06-01 NOTE — Telephone Encounter (Signed)
Patient called to cancel the nurse visit for 06/03/20. Sent message to scheduling to cancel appt.

## 2020-06-03 ENCOUNTER — Telehealth: Payer: BC Managed Care – PPO

## 2020-06-12 DIAGNOSIS — M1731 Unilateral post-traumatic osteoarthritis, right knee: Secondary | ICD-10-CM | POA: Diagnosis not present

## 2020-06-12 DIAGNOSIS — M25461 Effusion, right knee: Secondary | ICD-10-CM | POA: Diagnosis not present

## 2020-06-12 DIAGNOSIS — M25561 Pain in right knee: Secondary | ICD-10-CM | POA: Diagnosis not present

## 2020-06-12 DIAGNOSIS — Z9889 Other specified postprocedural states: Secondary | ICD-10-CM | POA: Diagnosis not present

## 2020-08-18 ENCOUNTER — Encounter: Payer: Self-pay | Admitting: Family Medicine

## 2020-08-19 MED ORDER — SILDENAFIL CITRATE 50 MG PO TABS
50.0000 mg | ORAL_TABLET | Freq: Every day | ORAL | 1 refills | Status: DC | PRN
Start: 2020-08-19 — End: 2020-08-24

## 2020-08-24 MED ORDER — SILDENAFIL CITRATE 50 MG PO TABS: 50.0000 mg | ORAL_TABLET | Freq: Every day | ORAL | 1 refills | Status: DC | PRN

## 2020-08-24 NOTE — Addendum Note (Signed)
Addended by: Glori Luis on: 08/24/2020 10:42 AM   Modules accepted: Orders

## 2020-11-16 ENCOUNTER — Encounter: Payer: Self-pay | Admitting: Family Medicine

## 2020-11-16 DIAGNOSIS — Z3009 Encounter for other general counseling and advice on contraception: Secondary | ICD-10-CM

## 2020-11-16 DIAGNOSIS — Z1211 Encounter for screening for malignant neoplasm of colon: Secondary | ICD-10-CM

## 2020-11-23 ENCOUNTER — Other Ambulatory Visit: Payer: Self-pay | Admitting: *Deleted

## 2020-11-23 DIAGNOSIS — R3915 Urgency of urination: Secondary | ICD-10-CM

## 2020-11-24 ENCOUNTER — Ambulatory Visit: Payer: BC Managed Care – PPO | Admitting: Urology

## 2020-11-24 ENCOUNTER — Other Ambulatory Visit: Payer: Self-pay

## 2020-11-24 ENCOUNTER — Other Ambulatory Visit
Admission: RE | Admit: 2020-11-24 | Discharge: 2020-11-24 | Disposition: A | Discharge status: Home / Self Care | Payer: BC Managed Care – PPO | Attending: Urology | Admitting: Urology

## 2020-11-24 DIAGNOSIS — R3915 Urgency of urination: Secondary | ICD-10-CM

## 2020-11-24 DIAGNOSIS — Z3009 Encounter for other general counseling and advice on contraception: Secondary | ICD-10-CM | POA: Diagnosis not present

## 2020-11-24 LAB — URINALYSIS, COMPLETE (UACMP) WITH MICROSCOPIC
Bilirubin Urine: NEGATIVE
Glucose, UA: NEGATIVE mg/dL
Hgb urine dipstick: NEGATIVE
Ketones, ur: NEGATIVE mg/dL
Leukocytes,Ua: NEGATIVE
Nitrite: NEGATIVE
Protein, ur: NEGATIVE mg/dL
Specific Gravity, Urine: 1.025 (ref 1.005–1.030)
pH: 6.5 (ref 5.0–8.0)

## 2020-11-24 MED ORDER — DIAZEPAM 5 MG PO TABS: 5.0000 mg | ORAL_TABLET | Freq: Once | ORAL | 0 refills | Status: DC | PRN

## 2020-11-24 NOTE — Patient Instructions (Addendum)
Pre-Vasectomy Instructions  STOP all aspirin or blood thinners (Aspirin, Plavix, Coumadin, Warfarin, Motrin, Ibuprofen, Advil, Aleve, Naproxen, Naprosyn) for 7 days prior to the procedure.  If you have any questions about stopping these medications please contact your primary care physician or cardiologist.  Shave all hair from the upper scrotum on the day of the procedure.  This means just under the penis onto the scrotal sac.  The area shaved should measure about 2-3 inches around.  You may lather the scrotum with soap and water, and shave with a safety razor.  After shaving the area, thoroughly wash the penis and the scrotum, then shower or bathe to remove all the loose hairs.  If needed, wash the area again just before coming in for your circumcision.  It is recommended to have a light meal an hour or so prior to the procedure.  Bring a scrotal support (jock strap or suspensory, or tight jockey shorts or underwear).  Wear comfortable pants or shorts.  While the actual procedure usually takes about 45 minutes, you should be prepared to stay in the office for approximately one hour.  Bring someone with you to drive you home.  If you have any questions or concerns, please feel free to call the office at [phone].      Vasectomy Vasectomy is a procedure in which the vas deferens is cut and then tied or burned (cauterized). The vas deferens is a tube that carries sperm from the testicle to the part of the body that drains urine from the bladder (urethra). This procedure blocks sperm from going through the vas deferens and penis during ejaculation. This ensures that sperm does not go into the vagina during sex. Vasectomy does not affect sexual desire or performance and does not prevent sexually transmitted infections. Vasectomy is considered a permanent and very effective form of birth control (contraception). The decision to have a vasectomy should not be made during a stressful time, such  as after the loss of a pregnancy or a divorce. You and your partner should decide on whether to have a vasectomy when you are sure that you do not want children in the future. Tell a health care provider about:  Any allergies you have.  All medicines you are taking, including vitamins, herbs, eye drops, creams, and over-the-counter medicines.  Any problems you or family members have had with anesthetic medicines.  Any blood disorders you have.  Any surgeries you have had.  Any medical conditions you have. What are the risks? Generally, this is a safe procedure. However, problems may occur, including:  Infection.  Bleeding and swelling of the scrotum. The scrotum is the sac that contains the testicles, blood vessels, and structures that help deliver sperm and semen.  Allergic reactions to medicines.  Failure of the procedure to prevent pregnancy. There is a very small chance that the tied or cauterized ends of the vas deferens may reconnect (recanalization). If this happens, you could still make a woman pregnant.  Pain in the scrotum that continues after you heal from the procedure. What happens before the procedure? Medicines  Ask your health care provider about: ? Changing or stopping your regular medicines. This is especially important if you are taking diabetes medicines or blood thinners. ? Taking medicines such as aspirin and ibuprofen. These medicines can thin your blood. Do not take these medicines unless your health care provider tells you to take them. ? Taking over-the-counter medicines, vitamins, herbs, and supplements.  You may be  told to take a medicine to help you relax (sedative) a few hours before the procedure. General instructions  Do not use any products that contain nicotine or tobacco for at least 4 weeks before the procedure. These products include cigarettes, e-cigarettes, and chewing tobacco. If you need help quitting, ask your health care  provider.  Plan to have a responsible adult take you home from the hospital or clinic.  If you will be going home right after the procedure, plan to have a responsible adult care for you for the time you are told. This is important.  Ask your health care provider: ? How your surgery site will be marked. ? What steps will be taken to help prevent infection. These steps may include:  Removing hair at the surgery site.  Washing skin with a germ-killing soap.  Taking antibiotic medicine. What happens during the procedure?  You will be given one or more of the following: ? A sedative, unless you were told to take this a few hours before the procedure. ? A medicine to numb the area (local anesthetic).  Your health care provider will feel, or palpate, for your vas deferens.  To reach the vas deferens, one of two methods may be used: ? A very small incision may be made in your scrotum. ? A punctured opening may be made in your scrotum, without an incision.  Your vas deferens will be pulled out of your scrotum and cut. Then, the vas deferens will be closed in one of two ways: ? Tied at the ends. ? Cauterized at the ends to seal them off.  The vas deferens will be put back into your scrotum.  The incision or puncture opening will be closed with absorbable stitches (sutures). The sutures will eventually dissolve and will not need to be removed after the procedure.  The procedure will be repeated on the other side of your scrotum. The procedure may vary among health care providers and hospitals.   What happens after the procedure?  You will be monitored to make sure that you do not have problems.  You will be asked not to ejaculate for at least 1 week after the procedure, or for as long as you are told.  You will need to use a different form of contraception for 2-4 months after the procedure, until you have test results confirming that there are no sperm in your semen.  You may be  given scrotal support to wear, such as a jockstrap or underwear with a supportive pouch.  If you were given a sedative during the procedure, it can affect you for several hours. Do not drive or operate machinery until your health care provider says that it is safe. Summary  Vasectomy blocks sperm from being released during ejaculation. This procedure is considered a permanent and very effective form of birth control.  Your scrotum will be numbed with medicine (local anesthetic) for the procedure.  After the procedure, you will be asked not to ejaculate for at least 1 week, or for as long as you are told. You will also need to use a different form of contraception until your test results confirm that there are no sperm in your semen. This information is not intended to replace advice given to you by your health care provider. Make sure you discuss any questions you have with your health care provider. Document Revised: 01/02/2020 Document Reviewed: 01/02/2020 Elsevier Patient Education  [place].   Vasectomy, Care After This sheet  gives you information about how to care for yourself after your procedure. Your health care provider may also give you more specific instructions. If you have problems or questions, contact your health care provider. What can I expect after the procedure? After the procedure, it is common to have:  Mild pain, swelling, or discomfort in your scrotum or redness on your scrotum.  Some blood coming from your incisions or puncture sites for 1 or 2 days.  Blood in your semen. Follow these instructions at home: Medicines  Take over-the-counter and prescription medicines only as told by your health care provider.  Avoid taking any medicines that contain aspirin or NSAIDs, such as ibuprofen. These medicines can make bleeding worse. Activity  For the first 2 days after surgery, avoid physical activity and exercise that requires a lot of energy. Ask your  health care provider what activities are safe for you.  Do not take part in sports or perform heavy physical labor until your pain has improved, or until your health care provider says it is okay.  You may have limits on the amount of weight you can lift as told by your health care provider.  Do not ejaculate for at least 1 week after the procedure, or for as long as you are told.  You may resume sexual activity 7-10 days after your procedure, or when your health care provider approves. Use a different method of birth control (contraception) until you have had test results that confirm that there is no sperm in your semen. Scrotal support  Use scrotal support, such as a jockstrap or underwear with a supportive pouch, as needed for 1 week after your procedure.  If you feel discomfort in your scrotum, you may remove the scrotal support to see if the discomfort is relieved. Sometimes scrotal support can press on the scrotum and cause or worsen discomfort.  If your skin gets irritated, you may add some germ-free (sterile), fluffed bandages or a clean washcloth to the scrotal support. Managing pain and swelling If directed, put ice on the affected area. To do this:  Put ice in a plastic bag.  Place a towel between your skin and the bag.  Leave the ice on for 20 minutes, 2-3 times a day.  Remove the ice if your skin turns bright red. This is very important. If you cannot feel pain, heat, or cold, you have a greater risk of damage to the area.   General instructions  Check your incisions or puncture sites every day for signs of infection. Check for: ? Redness, swelling, or more pain. ? Fluid or blood. ? Warmth. ? Pus or a bad smell.  Leave stitches (sutures) in place. The sutures will dissolve on their own and do not need to be removed.  Keep all follow-up visits. This is important because you will need a test to confirm that there is no sperm in your semen. Multiple ejaculations are  needed to clear out sperm that were beyond the vasectomy site. You will need one test result showing that there is no sperm in your semen before you can resume unprotected sex. This may take 2-4 months after your procedure.  If you were given a sedative during the procedure, it can affect you for several hours. Do not drive or operate machinery until your health care provider says that it is safe.   Contact a health care provider if:  You have redness, swelling, or more pain around an incision or puncture site, or  in your scrotum area.  You have bleeding from an incision or puncture site.  You have pus or a bad smell coming from an incision or puncture site.  You have a fever.  An incision or puncture site opens up. Get help right away if:  You develop a rash.  You have trouble breathing. Summary  After your procedure, it is common to have mild pain, swelling, redness, or discomfort in your scrotum.  For the first 2 days after surgery, avoid physical activity and exercise that requires a lot of energy.  Put ice on the affected area. Leave the ice on for 20 minutes, 2-3 times a day.  If you were given a sedative during the procedure, it can affect you for several hours. Do not drive or operate machinery until your health care provider says that it is safe. This information is not intended to replace advice given to you by your health care provider. Make sure you discuss any questions you have with your health care provider. Document Revised: 01/02/2020 Document Reviewed: 01/02/2020 Elsevier Patient Education  [place].

## 2020-11-24 NOTE — Progress Notes (Signed)
11/24/20 10:12 AM   Philip Reynolds [birth year] [id number]  CC: Discuss vasectomy  HPI: I saw Philip Reynolds in urology clinic today for discussion of vasectomy.  He is a healthy 50 year old male with a 52 year old daughter, and he does not desire any further biologic pregnancies.  He has ED that is well controlled with sildenafil.   PMH: Past Medical History:  Diagnosis Date  . Basal cell carcinoma of back   . Herpes infection 10/10/2014  . Herpetic whitlow    left ring finger; Dr. Denyce Reynolds  . Other and unspecified hyperlipidemia   . Umbilical hernia without mention of obstruction or gangrene     Surgical History: Past Surgical History:  Procedure Laterality Date  . ANTERIOR CRUCIATE LIGAMENT REPAIR (aka ACL) and meniscectomy  02/2007   Right knee (Dr. Francia Reynolds)  . HERNIA REPAIR Right 08/06/13   inguinal hernia  . HERNIA REPAIR  62/3/76   umbilical hernia  . Herpetic Whitlow  04/1997   Left hand followed by Dr. Linus [facility] at [facility] History: Family History  Problem Relation Age of Onset  . Diabetes Father   . Stroke Father        Post radiation for brain tumor  . Cancer Father        Brain tumor  . Depression Father   . Kidney disease Neg Hx   . Alcohol abuse Neg Hx   . Drug abuse Neg Hx   . CAD Neg Hx     Social History:  reports that he has never smoked. He has never used smokeless tobacco. He reports current alcohol use. He reports that he does not use drugs.  Physical Exam: Constitutional:  Alert and oriented, No acute distress. Cardiovascular: No clubbing, cyanosis, or edema. Respiratory: Normal respiratory effort, no increased work of breathing. GI: Abdomen is soft, nontender, nondistended, no abdominal masses GU: Circumcised phallus with patent meatus, vas deferens easily palpable bilaterally, no testicular lesions  Laboratory Data: None to review  Pertinent Imaging: None to review  Assessment & Plan:   Healthy 50 year old male who desires  vasectomy for permanent sterilization.  We discussed the risks and benefits of vasectomy at length.  Vasectomy is intended to be a permanent form of contraception, and does not produce immediate sterility.  Following vasectomy another form of contraception is required until vas occlusion is confirmed by a post-vasectomy semen analysis obtained 2-3 months after the procedure.  Even after vas occlusion is confirmed, vasectomy is not 100% reliable in preventing pregnancy, and the failure rate is approximately 08/1998.  Repeat vasectomy is required in less than 1% of patients.  He should refrain from ejaculation for 1 week after vasectomy.  Options for fertility after vasectomy include vasectomy reversal, and sperm retrieval with in vitro fertilization or ICSI.  These options are not always successful and may be expensive.  Finally, there are other permanent and non-permanent alternatives to vasectomy available. There is no risk of erectile dysfunction, and the volume of semen will be similar to prior, as the majority of the ejaculate is from the prostate and seminal vesicles.   The procedure takes ~20 minutes.  We recommend patients take 5-10 mg of Valium 30 minutes prior, and he will need a driver post-procedure.  Local anesthetic is injected into the scrotal skin and a small segment of the vas deferens is removed, and the ends occluded. The complication rate is approximately 1-2%, and includes bleeding, infection, and development of chronic scrotal pain.  PLAN: Pending  insurance approval, will schedule for vasectomy at his [place], MD 11/24/2020  [place] [address] [place], [place] [address] [phone]

## 2020-12-02 ENCOUNTER — Telehealth: Payer: BC Managed Care – PPO

## 2020-12-03 ENCOUNTER — Other Ambulatory Visit: Payer: Self-pay

## 2020-12-03 ENCOUNTER — Telehealth (INDEPENDENT_AMBULATORY_CARE_PROVIDER_SITE_OTHER): Payer: Self-pay | Admitting: Gastroenterology

## 2020-12-03 DIAGNOSIS — Z1211 Encounter for screening for malignant neoplasm of colon: Secondary | ICD-10-CM

## 2020-12-03 MED ORDER — PEG 3350-KCL-NA BICARB-NACL 420 G PO SOLR: 4000.0000 mL | Freq: Once | ORAL | 0 refills | Status: AC

## 2020-12-03 NOTE — Progress Notes (Signed)
Gastroenterology Pre-Procedure Review  Request Date: Tuesday 12/22/20 Requesting Physician: Dr. Bonna Gains  PATIENT REVIEW QUESTIONS: The patient responded to the following health history questions as indicated:    1. Are you having any GI issues? no 2. Do you have a personal history of Polyps? no 3. Do you have a family history of Colon Cancer or Polyps? no 4. Diabetes Mellitus? no 5. Joint replacements in the past 12 months?no 6. Major health problems in the past 3 months?no 7. Any artificial heart valves, MVP, or defibrillator?no    MEDICATIONS & ALLERGIES:    Patient reports the following regarding taking any anticoagulation/antiplatelet therapy:   Plavix, Coumadin, Eliquis, Xarelto, Lovenox, Pradaxa, Brilinta, or Effient? no Aspirin? no  Patient confirms/reports the following medications:  Current Outpatient Medications  Medication Sig Dispense Refill  . diazepam (VALIUM) 5 MG tablet Take 1 tablet (5 mg total) by mouth once as needed for up to 1 dose (Take 30 minutes prior to vasectomy). 1 tablet 0  . sildenafil (VIAGRA) 50 MG tablet Take 1 tablet (50 mg total) by mouth daily as needed for erectile dysfunction. 90 tablet 1   No current facility-administered medications for this visit.    Patient confirms/reports the following allergies:  No Known Allergies  No orders of the defined types were placed in this encounter.   AUTHORIZATION INFORMATION Primary Insurance: 1D#: Group #:  Secondary Insurance: 1D#: Group #:  SCHEDULE INFORMATION: Date: 12/22/20 Time: Location: [place]

## 2020-12-14 ENCOUNTER — Encounter: Payer: Self-pay | Admitting: Gastroenterology

## 2020-12-16 ENCOUNTER — Encounter: Payer: Self-pay | Admitting: Urology

## 2020-12-17 ENCOUNTER — Encounter: Payer: Self-pay | Admitting: Urology

## 2020-12-17 ENCOUNTER — Ambulatory Visit (INDEPENDENT_AMBULATORY_CARE_PROVIDER_SITE_OTHER): Payer: BC Managed Care – PPO | Admitting: Urology

## 2020-12-17 ENCOUNTER — Other Ambulatory Visit: Payer: Self-pay

## 2020-12-17 VITALS — BP 139/90 | HR 96 | Ht 72.0 in | Wt 205.0 lb

## 2020-12-17 DIAGNOSIS — Z302 Encounter for sterilization: Secondary | ICD-10-CM | POA: Diagnosis not present

## 2020-12-17 NOTE — Patient Instructions (Signed)

## 2020-12-17 NOTE — Progress Notes (Signed)
VASECTOMY PROCEDURE NOTE:  The patient was taken to the minor procedure room and placed in the supine position. His genitals were prepped and draped in the usual sterile fashion. The right vas deferens was brought up to the skin of the right upper scrotum. The skin overlying it was anesthetized with 1% lidocaine without epinephrine, anesthetic was also injected alongside the vas deferens in the direction of the inguinal canal. The no scalpel vasectomy instrument was used to make a small perforation in the scrotal skin. The vasectomy clamp was used to grasp the vas deferens. It was carefully dissected free from surrounding structures. A 1cm segment of the vas was removed, and the cut ends of the mucosa were cauterized. No significant bleeding was noted. The vas deferens was returned to the scrotum. The skin incision was closed with a simple interrupted stitch of 4-0 chromic.  Attention was then turned to the left side. The left vasectomy was performed in the same exact fashion. Sterile dressings were placed over each incision. The patient tolerated the procedure well.  IMPRESSION/DIAGNOSIS: The patient is a 50 year old gentleman who underwent a vasectomy today. Post-procedure instructions were reviewed. I stressed the importance of continuing to use birth control until he provides a semen specimen more than 2 months from now that demonstrates azoospermia.  We discussed return precautions including fever over 101, significant bleeding or hematoma, or uncontrolled pain. I also stressed the importance of avoiding strenuous activity for one week, no sexual activity or ejaculations for 5 days, intermittent icing over the next 48 hours, and scrotal support.   PLAN: The patient will be advised of his semen analysis results when available.   Nickolas Madrid, MD 12/17/2020

## 2020-12-22 ENCOUNTER — Ambulatory Visit: Payer: BC Managed Care – PPO | Admitting: Anesthesiology

## 2020-12-22 ENCOUNTER — Other Ambulatory Visit: Payer: Self-pay

## 2020-12-22 ENCOUNTER — Ambulatory Visit
Admission: RE | Admit: 2020-12-22 | Discharge: 2020-12-22 | Disposition: A | Discharge status: Home / Self Care | Payer: BC Managed Care – PPO | Source: Ambulatory Visit | Attending: Gastroenterology | Admitting: Gastroenterology

## 2020-12-22 ENCOUNTER — Encounter: Payer: Self-pay | Admitting: Gastroenterology

## 2020-12-22 ENCOUNTER — Encounter
Admission: RE | Disposition: A | Discharge status: Home / Self Care | Payer: Self-pay | Source: Ambulatory Visit | Attending: Gastroenterology

## 2020-12-22 DIAGNOSIS — D122 Benign neoplasm of ascending colon: Secondary | ICD-10-CM | POA: Diagnosis not present

## 2020-12-22 DIAGNOSIS — K635 Polyp of colon: Secondary | ICD-10-CM | POA: Diagnosis not present

## 2020-12-22 DIAGNOSIS — D120 Benign neoplasm of cecum: Secondary | ICD-10-CM | POA: Diagnosis not present

## 2020-12-22 DIAGNOSIS — Z1211 Encounter for screening for malignant neoplasm of colon: Secondary | ICD-10-CM | POA: Diagnosis not present

## 2020-12-22 DIAGNOSIS — D123 Benign neoplasm of transverse colon: Secondary | ICD-10-CM | POA: Insufficient documentation

## 2020-12-22 DIAGNOSIS — K648 Other hemorrhoids: Secondary | ICD-10-CM | POA: Insufficient documentation

## 2020-12-22 HISTORY — PX: COLONOSCOPY WITH PROPOFOL: SHX5780

## 2020-12-22 SURGERY — COLONOSCOPY WITH PROPOFOL
Anesthesia: General

## 2020-12-22 MED ORDER — ACETAMINOPHEN 160 MG/5ML PO SOLN: 325.0000 mg | ORAL | Status: DC | PRN

## 2020-12-22 MED ORDER — ACETAMINOPHEN 325 MG PO TABS: 325.0000 mg | ORAL_TABLET | ORAL | Status: DC | PRN

## 2020-12-22 MED ORDER — LIDOCAINE HCL (CARDIAC) PF 100 MG/5ML IV SOSY
PREFILLED_SYRINGE | INTRAVENOUS | Status: DC | PRN
  Administered 2020-12-22: 50 mg via INTRAVENOUS

## 2020-12-22 MED ORDER — SODIUM CHLORIDE 0.9 % IV SOLN: INTRAVENOUS | Status: DC

## 2020-12-22 MED ORDER — LACTATED RINGERS IV SOLN: INTRAVENOUS | Status: DC

## 2020-12-22 MED ORDER — ONDANSETRON HCL 4 MG/2ML IJ SOLN: 4.0000 mg | Freq: Once | INTRAMUSCULAR | Status: DC | PRN

## 2020-12-22 MED ORDER — STERILE WATER FOR IRRIGATION IR SOLN: Status: DC | PRN

## 2020-12-22 MED ORDER — PROPOFOL 10 MG/ML IV BOLUS
INTRAVENOUS | Status: DC | PRN
  Administered 2020-12-22 (×7): 30 mg via INTRAVENOUS
  Administered 2020-12-22: 150 mg via INTRAVENOUS
  Administered 2020-12-22: 30 mg via INTRAVENOUS
  Administered 2020-12-22: 40 mg via INTRAVENOUS
  Administered 2020-12-22: 30 mg via INTRAVENOUS
  Administered 2020-12-22: 40 mg via INTRAVENOUS

## 2020-12-22 SURGICAL SUPPLY — 11 items
FCP ESCP3.2XJMB 240X2.8X (MISCELLANEOUS) ×1
FORCEPS BIOP RJ4 240 W/NDL (MISCELLANEOUS) ×2
FORCEPS ESCP3.2XJMB 240X2.8X (MISCELLANEOUS) ×1 IMPLANT
GOWN CVR UNV OPN BCK APRN NK (MISCELLANEOUS) ×2 IMPLANT
GOWN ISOL THUMB LOOP REG UNIV (MISCELLANEOUS) ×4
KIT PRC NS LF DISP ENDO (KITS) ×1 IMPLANT
KIT PROCEDURE OLYMPUS (KITS) ×2
MANIFOLD NEPTUNE II (INSTRUMENTS) ×2 IMPLANT
SNARE COLD EXACTO (MISCELLANEOUS) ×2 IMPLANT
TRAP ETRAP POLY (MISCELLANEOUS) ×2 IMPLANT
WATER STERILE IRR 250ML POUR (IV SOLUTION) ×2 IMPLANT

## 2020-12-22 NOTE — Op Note (Signed)
[facility] Patient Name: Philip Reynolds Procedure Date: 12/22/2020 8:55 AM MRN: [id number] Account #: [id number] Date of Birth: [birth year] Admit Type: Outpatient Age: 50 Room: [facility] OR ROOM 01 Gender: Male Note Status: Finalized Procedure:             Colonoscopy Indications:           Screening for colorectal malignant neoplasm Providers:             Jefry Lesinski B. Bonna Gains MD, MD Referring MD:          Angela Adam. Caryl Bis (Referring MD) Medicines:             Monitored Anesthesia Care Complications:         No immediate complications. Procedure:             Pre-Anesthesia Assessment:                        - ASA Grade Assessment: II - A patient with mild                         systemic disease.                        - Prior to the procedure, a History and Physical was                         performed, and patient medications, allergies and                         sensitivities were reviewed. The patient's tolerance                         of previous anesthesia was reviewed.                        - The risks and benefits of the procedure and the                         sedation options and risks were discussed with the                         patient. All questions were answered and informed                         consent was obtained.                        - Patient identification and proposed procedure were                         verified prior to the procedure by the physician, the                         nurse, the anesthesiologist, the anesthetist and the                         technician. The procedure was verified in the                         procedure room.  After obtaining informed consent, the colonoscope was                         passed under direct vision. Throughout the procedure,                         the patient's blood pressure, pulse, and oxygen                         saturations were monitored  continuously. The was                         introduced through the anus and advanced to the the                         terminal ileum. The colonoscopy was performed with                         ease. The patient tolerated the procedure well. The                         quality of the bowel preparation was good. Findings:      The perianal and digital rectal examinations were normal.      Two flat polyps were found in the ascending colon and cecum. The polyps       were 3 to 4 mm in size. These polyps were removed with a jumbo cold       forceps. Resection and retrieval were complete.      A 5 mm polyp was found in the transverse colon. The polyp was sessile.       The polyp was removed with a cold snare. Resection and retrieval were       complete.      The exam was otherwise without abnormality.      The rectum, sigmoid colon, descending colon, transverse colon, ascending       colon, cecum and ileum appeared normal.      Non-bleeding internal hemorrhoids were found during retroflexion.      No additional abnormalities were found on retroflexion. Impression:            - Two 3 to 4 mm polyps in the ascending colon and in                         the cecum, removed with a jumbo cold forceps. Resected                         and retrieved.                        - One 5 mm polyp in the transverse colon, removed with                         a cold snare. Resected and retrieved.                        - The examination was otherwise normal.                        - The rectum,  sigmoid colon, descending colon,                         transverse colon, ascending colon and cecum are normal.                        - Non-bleeding internal hemorrhoids. Recommendation:        - Discharge patient to home (with escort).                        - Advance diet as tolerated.                        - Continue present medications.                        - Await pathology results.                         - Repeat colonoscopy date to be determined after                         pending pathology results are reviewed.                        - The findings and recommendations were discussed with                         the patient.                        - The findings and recommendations were discussed with                         the patient's family.                        - Return to primary care physician as previously                         scheduled. Procedure Code(s):     --- Professional ---                        [phone], Colonoscopy, flexible; with removal of                         tumor(s), polyp(s), or other lesion(s) by snare                         technique                        45380, 35, Colonoscopy, flexible; with biopsy, single                         or multiple Diagnosis Code(s):     --- Professional ---                        Z12.11, Encounter for screening for malignant neoplasm  of colon                        K63.5, Polyp of colon CPT copyright 2019 American Medical Association. All rights reserved. The codes documented in this report are preliminary and upon coder review may  be revised to meet current compliance requirements.  Vonda Antigua, MD Margretta Sidle B. Bonna Gains MD, MD 12/22/2020 9:40:15 AM This report has been signed electronically. Number of Addenda: 0 Note Initiated On: 12/22/2020 8:55 AM Scope Withdrawal Time: 0 hours 23 minutes 14 seconds  Total Procedure Duration: 0 hours 28 minutes 37 seconds  Estimated Blood Loss:  Estimated blood loss: none.      [facility]

## 2020-12-22 NOTE — Anesthesia Preprocedure Evaluation (Signed)
Anesthesia Evaluation  Patient identified by MRN, date of birth, ID band Patient awake    Airway Mallampati: II  TM Distance: >3 FB     Dental   Pulmonary neg pulmonary ROS,    Pulmonary exam normal        Cardiovascular negative cardio ROS   Rhythm:Regular Rate:Normal     Neuro/Psych    GI/Hepatic   Endo/Other    Renal/GU      Musculoskeletal   Abdominal   Peds  Hematology   Anesthesia Other Findings   Reproductive/Obstetrics                             Anesthesia Physical Anesthesia Plan  ASA: I  Anesthesia Plan: General   Post-op Pain Management:    Induction: Intravenous  PONV Risk Score and Plan: Propofol infusion, TIVA and Treatment may vary due to age or medical condition  Airway Management Planned: Natural Airway and Nasal Cannula  Additional Equipment:   Intra-op Plan:   Post-operative Plan:   Informed Consent: I have reviewed the patients History and Physical, chart, labs and discussed the procedure including the risks, benefits and alternatives for the proposed anesthesia with the patient or authorized representative who has indicated his/her understanding and acceptance.       Plan Discussed with: CRNA  Anesthesia Plan Comments:         Anesthesia Quick Evaluation

## 2020-12-22 NOTE — Discharge Instructions (Signed)

## 2020-12-22 NOTE — H&P (Signed)
Vonda Antigua, MD [address], [place], [place], [place], [address] [address], [place], [place], [place], [address] Phone: [phone]  Fax: [phone]  Primary Care Physician:  Leone Haven, MD   Pre-Procedure History & Physical: HPI:  Philip Reynolds is a 50 y.o. male is here for a colonoscopy.   Past Medical History:  Diagnosis Date  . Basal cell carcinoma of back   . Herpes infection 10/10/2014  . Herpetic whitlow    left ring finger; Dr. Denyce Robert  . Other and unspecified hyperlipidemia   . Umbilical hernia without mention of obstruction or gangrene     Past Surgical History:  Procedure Laterality Date  . ANTERIOR CRUCIATE LIGAMENT REPAIR (aka ACL) and meniscectomy  02/2007   Right knee (Dr. Francia Greaves)  . HERNIA REPAIR Right 08/06/13   inguinal hernia  . HERNIA REPAIR  92/1/19   umbilical hernia  . Herpetic Whitlow  04/1997   Left hand [facility] Linus [facility]    Prior to Admission medications   Not on File    Allergies as of 12/03/2020  . (No Known Allergies)    Family History  Problem Relation Age of Onset  . Diabetes Father   . Stroke Father        Post radiation for brain tumor  . Cancer Father        Brain tumor  . Depression Father   . Kidney disease Neg Hx   . Alcohol abuse Neg Hx   . Drug abuse Neg Hx   . CAD Neg Hx     Social History   Socioeconomic History  . Marital status: Married    Spouse name: Not on file  . Number of children: 0  . Years of education: Not on file  . Highest education level: Not on file  Occupational History  . Occupation: Self owned Dealer business    Comment: 04/2005  Tobacco Use  . Smoking status: Never Smoker  . Smokeless tobacco: Never Used  Substance and Sexual Activity  . Alcohol use: Yes    Comment: Occasional  . Drug use: No  . Sexual activity: Not on file  Other Topics Concern  . Not on file  Social History Narrative   Lives with wife Osceola Depaz   Occ:  Warehouse manager business.   Activity: regular gym 5d /wk   Diet: tries to eat healthy, avoids fast food, good water, fruits/vegetables daily   Social Determinants of Health   Financial Resource Strain: Not on file  Food Insecurity: Not on file  Transportation Needs: Not on file  Physical Activity: Not on file  Stress: Not on file  Social Connections: Not on file  Intimate Partner Violence: Not on file    Review of Systems: See HPI, otherwise negative ROS  Physical Exam: BP (!) 122/91   Pulse 73   Temp (!) 97.3 F (36.3 C) (Temporal)   Resp 16   Ht 6' (1.829 m)   Wt 90.7 kg   SpO2 99%   BMI 27.12 kg/m  General:   Alert,  pleasant and cooperative in NAD Head:  Normocephalic and atraumatic. Neck:  Supple; no masses or thyromegaly. Lungs:  Clear throughout to auscultation, normal respiratory effort.    Heart:  +S1, +S2, Regular rate and rhythm, No edema. Abdomen:  Soft, nontender and nondistended. Normal bowel sounds, without guarding, and without rebound.   Neurologic:  Alert and  oriented x4;  grossly normal neurologically.  Impression/Plan: Philip Reynolds is here for a colonoscopy to be performed for average risk screening.  Risks, benefits, limitations, and alternatives regarding  colonoscopy have been reviewed with the patient.  Questions have been answered.  All parties agreeable.   Virgel Manifold, MD  12/22/2020, 8:55 AM

## 2020-12-22 NOTE — Anesthesia Procedure Notes (Signed)
Date/Time: 12/22/2020 9:04 AM Performed by: Cameron Ali, CRNA Pre-anesthesia Checklist: Patient identified, Emergency Drugs available, Suction available, Timeout performed and Patient being monitored Patient Re-evaluated:Patient Re-evaluated prior to induction Oxygen Delivery Method: Nasal cannula Placement Confirmation: positive ETCO2

## 2020-12-22 NOTE — Transfer of Care (Signed)
Immediate Anesthesia Transfer of Care Note  Patient: Philip Reynolds  Procedure(s) Performed: COLONOSCOPY WITH POLYPECTOMY (N/A )  Patient Location: PACU  Anesthesia Type: General  Level of Consciousness: awake, alert  and patient cooperative  Airway and Oxygen Therapy: Patient Spontanous Breathing and Patient connected to supplemental oxygen  Post-op Assessment: Post-op Vital signs reviewed, Patient's Cardiovascular Status Stable, Respiratory Function Stable, Patent Airway and No signs of Nausea or vomiting  Post-op Vital Signs: Reviewed and stable  Complications: No complications documented.

## 2020-12-22 NOTE — Anesthesia Postprocedure Evaluation (Signed)
Anesthesia Post Note  Patient: Philip Reynolds  Procedure(s) Performed: COLONOSCOPY WITH POLYPECTOMY (N/A )     Patient location during evaluation: PACU Anesthesia Type: General Level of consciousness: awake Pain management: pain level controlled Vital Signs Assessment: post-procedure vital signs reviewed and stable Respiratory status: respiratory function stable Cardiovascular status: stable Postop Assessment: no signs of nausea or vomiting Anesthetic complications: no   No complications documented.  Veda Canning

## 2020-12-24 LAB — SURGICAL PATHOLOGY

## 2020-12-29 ENCOUNTER — Encounter: Payer: Self-pay | Admitting: Gastroenterology

## 2021-03-15 ENCOUNTER — Encounter: Payer: Self-pay | Admitting: Family Medicine

## 2021-03-18 ENCOUNTER — Ambulatory Visit (INDEPENDENT_AMBULATORY_CARE_PROVIDER_SITE_OTHER): Payer: BC Managed Care – PPO

## 2021-03-18 ENCOUNTER — Ambulatory Visit: Payer: BC Managed Care – PPO | Admitting: Family Medicine

## 2021-03-18 ENCOUNTER — Other Ambulatory Visit: Payer: Self-pay

## 2021-03-18 ENCOUNTER — Encounter: Payer: Self-pay | Admitting: Family Medicine

## 2021-03-18 VITALS — BP 110/80 | HR 72 | Temp 98.1°F | Ht 72.0 in | Wt 201.4 lb

## 2021-03-18 DIAGNOSIS — G8929 Other chronic pain: Secondary | ICD-10-CM

## 2021-03-18 DIAGNOSIS — M542 Cervicalgia: Secondary | ICD-10-CM

## 2021-03-18 DIAGNOSIS — R229 Localized swelling, mass and lump, unspecified: Secondary | ICD-10-CM | POA: Diagnosis not present

## 2021-03-18 DIAGNOSIS — M25519 Pain in unspecified shoulder: Secondary | ICD-10-CM | POA: Insufficient documentation

## 2021-03-18 LAB — BASIC METABOLIC PANEL
BUN: 21 mg/dL (ref 6–23)
CO2: 26 mEq/L (ref 19–32)
Calcium: 9.8 mg/dL (ref 8.4–10.5)
Chloride: 103 mEq/L (ref 96–112)
Creatinine, Ser: 0.91 mg/dL (ref 0.40–1.50)
GFR: 100.12 mL/min (ref >60.00)
Glucose, Bld: 76 mg/dL (ref 70–99)
Potassium: 4.4 mEq/L (ref 3.5–5.1)
Sodium: 139 mEq/L (ref 135–145)

## 2021-03-18 MED ORDER — MELOXICAM 7.5 MG PO TABS: 7.5000 mg | ORAL_TABLET | Freq: Every day | ORAL | 0 refills | Status: DC

## 2021-03-18 NOTE — Assessment & Plan Note (Signed)
Chronic issue.  Suspect osteoarthritis though we will obtain an x-ray to evaluate further.  We will start on meloxicam.  He was advised not to take any over-the-counter NSAIDs while taking the meloxicam.  He will take the meloxicam with food.  Given his ongoing use of NSAIDs we will check a BMP.

## 2021-03-18 NOTE — Progress Notes (Signed)
  [name], MD Phone: [phone]  Philip Reynolds is a 50 y.o. male who presents today for same-day visit.  Chronic neck pain: This is been going on at least 6 to 8 months.  Notes its the right side of his neck down into his trapezius muscle.  Notes there is a cracking discomfort.  There is no radiation down his arms or legs.  No numbness or tingling.  No weakness.  He has been taking ibuprofen 2 tablets 3-4 times a day for a while.  No injury.  No motor vehicle accident.  Notes it is progressively getting worse.  Cervical nodule: This has been present in his posterior neck for a long time.  He had an ultrasound and a CT scan was recommended though he declined that.  He notes no change to this area.  Social History   Tobacco Use  Smoking Status Never  Smokeless Tobacco Never    No current outpatient medications on file prior to visit.   No current facility-administered medications on file prior to visit.     ROS see history of present illness  Objective  Physical Exam Vitals:   03/18/21 1214  BP: 110/80  Pulse: 72  Temp: 98.1 F (36.7 C)  SpO2: 99%    BP Readings from Last 3 Encounters:  03/18/21 110/80  12/22/20 (!) 115/94  12/17/20 139/90   Wt Readings from Last 3 Encounters:  03/18/21 201 lb 6.4 oz (91.4 kg)  12/22/20 200 lb (90.7 kg)  12/17/20 205 lb (93 kg)    Physical Exam Constitutional:      General: He is not in acute distress.    Appearance: He is not diaphoretic.  Neck:   Pulmonary:     Effort: Pulmonary effort is normal.  Musculoskeletal:     Comments: No midline back tenderness, no midline neck step-off, no muscular neck tenderness, he has full range of motion of his neck  Skin:    General: Skin is warm and dry.  Neurological:     Mental Status: He is alert.     Comments: 5/5 strength in bilateral biceps, triceps, grip, quads, hamstrings, plantar and dorsiflexion, sensation to light touch intact in bilateral UE and LE, normal gait      Assessment/Plan: Please see individual problem list.  Problem List Items Addressed This Visit     Chronic neck pain - Primary    Chronic issue.  Suspect osteoarthritis though we will obtain an x-ray to evaluate further.  We will start on meloxicam.  He was advised not to take any over-the-counter NSAIDs while taking the meloxicam.  He will take the meloxicam with food.  Given his ongoing use of NSAIDs we will check a BMP.       Relevant Medications   meloxicam (MOBIC) 7.5 MG tablet   Other Relevant Orders   DG Cervical Spine Complete   Basic Metabolic Panel (BMET)   Subcutaneous nodule    Chronic lesion.  This has not changed over several years.  He will continue to monitor the area.         Return in about 6 weeks (around 04/29/2021) for Chronic neck pain.  This visit occurred during the SARS-CoV-2 public health emergency.  Safety protocols were in place, including screening questions prior to the visit, additional usage of staff PPE, and extensive cleaning of exam room while observing appropriate contact time as indicated for disinfecting solutions.    [name], MD [place]

## 2021-03-18 NOTE — Patient Instructions (Addendum)
Nice to see you. We are going to get an x-ray and lab work today. Please try the meloxicam.  Please take this with food.  If it upsets your stomach please let us know.  Do not take any ibuprofen or Aleve while taking the meloxicam.

## 2021-03-18 NOTE — Assessment & Plan Note (Signed)
Chronic lesion.  This has not changed over several years.  He will continue to monitor the area.

## 2021-03-19 ENCOUNTER — Other Ambulatory Visit: Payer: Self-pay

## 2021-05-18 ENCOUNTER — Encounter: Payer: Self-pay | Admitting: Family Medicine

## 2021-05-18 ENCOUNTER — Other Ambulatory Visit: Payer: Self-pay

## 2021-05-18 ENCOUNTER — Ambulatory Visit (INDEPENDENT_AMBULATORY_CARE_PROVIDER_SITE_OTHER): Payer: BC Managed Care – PPO

## 2021-05-18 ENCOUNTER — Ambulatory Visit (INDEPENDENT_AMBULATORY_CARE_PROVIDER_SITE_OTHER): Payer: BC Managed Care – PPO | Admitting: Family Medicine

## 2021-05-18 VITALS — BP 118/80 | HR 78 | Temp 97.0°F | Ht 72.0 in | Wt 203.4 lb

## 2021-05-18 DIAGNOSIS — Z0001 Encounter for general adult medical examination with abnormal findings: Secondary | ICD-10-CM | POA: Diagnosis not present

## 2021-05-18 DIAGNOSIS — L989 Disorder of the skin and subcutaneous tissue, unspecified: Secondary | ICD-10-CM | POA: Insufficient documentation

## 2021-05-18 DIAGNOSIS — M542 Cervicalgia: Secondary | ICD-10-CM | POA: Diagnosis not present

## 2021-05-18 DIAGNOSIS — N529 Male erectile dysfunction, unspecified: Secondary | ICD-10-CM

## 2021-05-18 DIAGNOSIS — E785 Hyperlipidemia, unspecified: Secondary | ICD-10-CM

## 2021-05-18 DIAGNOSIS — M25512 Pain in left shoulder: Secondary | ICD-10-CM | POA: Insufficient documentation

## 2021-05-18 DIAGNOSIS — E663 Overweight: Secondary | ICD-10-CM | POA: Insufficient documentation

## 2021-05-18 DIAGNOSIS — Z0000 Encounter for general adult medical examination without abnormal findings: Secondary | ICD-10-CM

## 2021-05-18 DIAGNOSIS — G8929 Other chronic pain: Secondary | ICD-10-CM

## 2021-05-18 LAB — LIPID PANEL
Cholesterol: 222 mg/dL — ABNORMAL HIGH (ref 0–200)
HDL: 39 mg/dL — ABNORMAL LOW (ref >39.00)
LDL Cholesterol: 152 mg/dL — ABNORMAL HIGH (ref 0–99)
NonHDL: 182.68
Total CHOL/HDL Ratio: 6
Triglycerides: 152 mg/dL — ABNORMAL HIGH (ref 0.0–149.0)
VLDL: 30.4 mg/dL (ref 0.0–40.0)

## 2021-05-18 LAB — COMPREHENSIVE METABOLIC PANEL
ALT: 21 U/L (ref 0–53)
AST: 17 U/L (ref 0–37)
Albumin: 4.7 g/dL (ref 3.5–5.2)
Alkaline Phosphatase: 65 U/L (ref 39–117)
BUN: 18 mg/dL (ref 6–23)
CO2: 28 mEq/L (ref 19–32)
Calcium: 9.9 mg/dL (ref 8.4–10.5)
Chloride: 105 mEq/L (ref 96–112)
Creatinine, Ser: 0.96 mg/dL (ref 0.40–1.50)
GFR: 93.79 mL/min (ref >60.00)
Glucose, Bld: 98 mg/dL (ref 70–99)
Potassium: 4.5 mEq/L (ref 3.5–5.1)
Sodium: 140 mEq/L (ref 135–145)
Total Bilirubin: 0.7 mg/dL (ref 0.2–1.2)
Total Protein: 7.7 g/dL (ref 6.0–8.3)

## 2021-05-18 LAB — HEMOGLOBIN A1C: Hgb A1c MFr Bld: 5.3 % (ref 4.6–6.5)

## 2021-05-18 MED ORDER — SILDENAFIL CITRATE 50 MG PO TABS: 50.0000 mg | ORAL_TABLET | Freq: Every day | ORAL | 0 refills | Status: DC | PRN

## 2021-05-18 MED ORDER — MELOXICAM 7.5 MG PO TABS: 7.5000 mg | ORAL_TABLET | Freq: Every day | ORAL | 0 refills | Status: DC | PRN

## 2021-05-18 NOTE — Progress Notes (Signed)
Philip Rumps, MD Phone: [phone]  Philip Reynolds is a 50 y.o. male who presents today for CPE.  Diet: Generally healthy, he is cut back on portion sizes.  He eats plenty of vegetables.  He does have occasional soda. Exercise: Walks 4 times a week.  Occasionally does weights.  He wants to get back to going to the gym 4-5 times a week. Colonoscopy: 12/22/20 with 3 year recall  Prostate cancer screening: not indicated Family history-  Prostate cancer: no  Colon cancer: no Vaccines-   Flu: will get later in the season  Tetanus: UTD  COVID19: declines HIV screening: declined previously Hep C Screening: previously done Tobacco use: no Alcohol use: 2/night, working on cutting back Illicit Drug use: no Dentist: yes Ophthalmology: saw last year  Chronic neck pain: Patient notes this improved significantly on the meloxicam.  He notes the pain has come back in a very mild way.  He is taking 4 ibuprofen per day at this point.  No radiation.  No numbness or weakness.  Left shoulder pain: Patient notes this has been going on over the last several months.  He feels as though it has come on with loss of muscle mass from not working out.  Notes the pain only hurts him when he sleeps.  Notes it is a 6 out of 10.  It is a sharp pain when he sleeps on it.  No range of motion issues.  No injuries.  Skin lesions: Patient notes a couple of spots on his scalp and one on his left forearm that have become more prominent.  He does have lots of sun exposure.  Erectile dysfunction: Patient notes that sildenafil has worked quite well for him.  Active Ambulatory Problems    Diagnosis Date Noted   CARCINOMA, BASAL CELL, BACK 05/28/2009   Hyperlipidemia 09/30/2008   Lipoma of skin and subcutaneous tissue 05/18/2011   Encounter for general adult medical examination without abnormal findings 07/18/2013   Herpesviral infection 10/10/2014   Nevus 03/09/2017   Subcutaneous nodule 03/20/2019   Vision  changes 03/20/2019   Erectile dysfunction 10/30/2019   Left hand pain 05/18/2020   Zoster without complications 01/75/1025   Benign lipomatous neoplasm of skin and subcutaneous tissue 05/18/2011   Encounter for screening colonoscopy    Polyp of transverse colon    Cecal polyp    Chronic neck pain 03/18/2021   Left shoulder pain 05/18/2021   Overweight 05/18/2021   Skin lesions 05/18/2021   Resolved Ambulatory Problems    Diagnosis Date Noted   UMBILICAL HERNIA 85/27/7824   Right inguinal hernia 23/53/6144   Umbilical hernia 31/54/0086   Atypical chest pain 03/20/2019   Abnormal breathing 10/30/2019   Past Medical History:  Diagnosis Date   Basal cell carcinoma of back    Herpes infection 10/10/2014   Herpetic whitlow    Other and unspecified hyperlipidemia     Family History  Problem Relation Age of Onset   Diabetes Father    Stroke Father        Post radiation for brain tumor   Cancer Father        Brain tumor   Depression Father    Kidney disease Neg Hx    Alcohol abuse Neg Hx    Drug abuse Neg Hx    CAD Neg Hx     Social History   Socioeconomic History   Marital status: Divorced    Spouse name: Not on file   Number of children:  0   Years of education: Not on file   Highest education level: Not on file  Occupational History   Occupation: Self owned Dealer business    Comment: 04/2005  Tobacco Use   Smoking status: Never   Smokeless tobacco: Never  Substance and Sexual Activity   Alcohol use: Yes    Comment: Occasional   Drug use: No   Sexual activity: Not on file  Other Topics Concern   Not on file  Social History Narrative   Lives with wife Delane Wessinger   Occ: Warehouse manager business.   Activity: regular gym 5d /wk   Diet: tries to eat healthy, avoids fast food, good water, fruits/vegetables daily   Social Determinants of Health   Financial Resource Strain: Not on file  Food Insecurity: Not on file  Transportation Needs: Not on  file  Physical Activity: Not on file  Stress: Not on file  Social Connections: Not on file  Intimate Partner Violence: Not on file    ROS  General:  Negative for nexplained weight loss, fever Skin: Positive for new or changing mole, negative for sore that won't heal HEENT: Positive for trouble seeing (wears readers), negative for trouble hearing, ringing in ears, mouth sores, hoarseness, change in voice, dysphagia. CV:  Negative for chest pain, dyspnea, edema, palpitations Resp: Negative for cough, dyspnea, hemoptysis GI: Negative for nausea, vomiting, diarrhea, constipation, abdominal pain, melena, hematochezia. GU: Positive for sexual difficulty, negative for dysuria, incontinence, urinary hesitance, hematuria, vaginal or penile discharge, polyuria, lumps in testicle or breasts MSK: Positive for joint pain, negative for muscle cramps or aches, joint swelling Neuro: Negative for headaches, weakness, numbness, dizziness, passing out/fainting Psych: Negative for depression, anxiety, memory problems  Objective  Physical Exam Vitals:   05/18/21 0841 05/18/21 0908  BP: 139/80 118/80  Pulse: 78   Temp: (!) 97 F (36.1 C)   SpO2: 98%     BP Readings from Last 3 Encounters:  05/18/21 118/80  03/18/21 110/80  12/22/20 (!) 115/94   Wt Readings from Last 3 Encounters:  05/18/21 203 lb 6.4 oz (92.3 kg)  03/18/21 201 lb 6.4 oz (91.4 kg)  12/22/20 200 lb (90.7 kg)    Physical Exam Constitutional:      General: He is not in acute distress.    Appearance: He is not diaphoretic.  HENT:     Head: Normocephalic and atraumatic.     Comments: 3-4 nevi versus seborrheic keratoses on the left anterior scalp Eyes:     Conjunctiva/sclera: Conjunctivae normal.     Pupils: Pupils are equal, round, and reactive to light.  Cardiovascular:     Rate and Rhythm: Normal rate and regular rhythm.     Heart sounds: Normal heart sounds.  Pulmonary:     Effort: Pulmonary effort is normal.      Breath sounds: Normal breath sounds.  Abdominal:     General: Bowel sounds are normal. There is no distension.     Palpations: Abdomen is soft.     Tenderness: There is no abdominal tenderness.  Musculoskeletal:     Right lower leg: No edema.     Left lower leg: No edema.     Comments: Full range of motion bilateral shoulders actively and passively, there is some discomfort on passive external range of motion of the right shoulder, no tenderness of either shoulder, the shoulders are symmetric, negative empty can bilaterally, negative speeds bilaterally  Lymphadenopathy:     Cervical: No cervical adenopathy.  Skin:  General: Skin is warm and dry.     Comments: 3 to 4 mm diameter seborrheic keratosis on his left dorsal forearm  Neurological:     Mental Status: He is alert.  Psychiatric:        Mood and Affect: Mood normal.     Assessment/Plan:   Problem List Items Addressed This Visit     Chronic neck pain    Chronic issue.  Offered referral for physical therapy though he declines this.  We will give him a 2-week course of meloxicam.  If this continues to be an issue he will let us know.      Relevant Medications   meloxicam (MOBIC) 7.5 MG tablet   Encounter for general adult medical examination without abnormal findings - Primary    I encouraged continued healthy diet.  Discussed getting back to exercising more.  Colonoscopy is up-to-date.  Discussed prostate cancer screening starting at age 72.  He defers his flu vaccine later in the season.  He declines COVID-vaccine.  I discussed the purpose of the COVID-vaccine and advised that it would be available if he changed his mind.  I encouraged him to periodically see an ophthalmologist.  Lab work as outlined.      Erectile dysfunction    Stable.  He can continue sildenafil 50 mg daily as needed.      Relevant Medications   sildenafil (VIAGRA) 50 MG tablet   Hyperlipidemia   Relevant Medications   sildenafil (VIAGRA) 50 MG  tablet   Other Relevant Orders   Lipid panel   Comp Met (CMET)   Left shoulder pain    Possible impingement syndrome.  We will get an x-ray today.  He was given exercises to complete at home.  If not improving he will let us know.      Relevant Orders   DG Shoulder Left   Overweight   Relevant Orders   HgB A1c   Skin lesions    Likely benign nevi versus seborrheic keratoses.  We will refer to dermatology.      Relevant Orders   Ambulatory referral to Dermatology    Return in about 1 year (around 05/18/2022) for CPE.  This visit occurred during the SARS-CoV-2 public health emergency.  Safety protocols were in place, including screening questions prior to the visit, additional usage of staff PPE, and extensive cleaning of exam room while observing appropriate contact time as indicated for disinfecting solutions.    Philip Rumps, MD [place]

## 2021-05-18 NOTE — Assessment & Plan Note (Signed)
Possible impingement syndrome.  We will get an x-ray today.  He was given exercises to complete at home.  If not improving he will let us know.

## 2021-05-18 NOTE — Assessment & Plan Note (Signed)
Likely benign nevi versus seborrheic keratoses.  We will refer to dermatology.

## 2021-05-18 NOTE — Assessment & Plan Note (Signed)
Stable.  He can continue sildenafil 50 mg daily as needed.

## 2021-05-18 NOTE — Assessment & Plan Note (Signed)
Chronic issue.  Offered referral for physical therapy though he declines this.  We will give him a 2-week course of meloxicam.  If this continues to be an issue he will let us know.

## 2021-05-18 NOTE — Assessment & Plan Note (Signed)
I encouraged continued healthy diet.  Discussed getting back to exercising more.  Colonoscopy is up-to-date.  Discussed prostate cancer screening starting at age 48.  He defers his flu vaccine later in the season.  He declines COVID-vaccine.  I discussed the purpose of the COVID-vaccine and advised that it would be available if he changed his mind.  I encouraged him to periodically see an ophthalmologist.  Lab work as outlined.

## 2021-05-18 NOTE — Patient Instructions (Signed)
Nice to see you. We will get lab work today. Number to get an x-ray of your left shoulder as well. Please try the meloxicam for your neck for 14 days.  You need to take this with food.  If it irritates her stomach please let us know. Somebody from dermatology should call you to schedule an appointment. Please try to get back to exercising. Please do the exercises for your shoulder.  Shoulder Impingement Syndrome Rehab Ask your health care provider which exercises are safe for you. Do exercises exactly as told by your health care provider and adjust them as directed. It is normal to feel mild stretching, pulling, tightness, or discomfort as you do these exercises. Stop right away if you feel sudden pain or your pain gets worse. Do not begin these exercises until told by your health care provider. Stretching and range-of-motion exercise This exercise warms up your muscles and joints and improves the movement and flexibility of your shoulder. This exercise also helps to relieve pain and stiffness. Passive horizontal adduction In passive adduction, you use your other hand to move the injured arm toward your body. The injured arm does not move on its own. In this movement, your arm is moved across your body in the horizontal plane (horizontal adduction). Sit or stand and pull your left / right elbow across your chest, toward your other shoulder. Stop when you feel a gentle stretch in the back of your shoulder and upper arm. Keep your arm at shoulder height. Keep your arm as close to your body as you comfortably can. Hold for __________ seconds. Slowly return to the starting position. Repeat __________ times. Complete this exercise __________ times a day. Strengthening exercises These exercises build strength and endurance in your shoulder. Endurance is the ability to use your muscles for a long time, even after they get tired. External rotation, isometric This is an exercise in which you press the  back of your wrist against a door frame without moving your shoulder joint (isometric). Stand or sit in a doorway, facing the door frame. Bend your left / right elbow and place the back of your wrist against the door frame. Only the back of your wrist should be touching the frame. Keep your upper arm at your side. Gently press your wrist against the door frame, as if you are trying to push your arm away from your abdomen (external rotation). Press as hard as you are able without pain. Avoid shrugging your shoulder while you press your wrist against the door frame. Keep your shoulder blade tucked down toward the middle of your back. Hold for __________ seconds. Slowly release the tension, and relax your muscles completely before you repeat the exercise. Repeat __________ times. Complete this exercise __________ times a day. Internal rotation, isometric This is an exercise in which you press your palm against a door frame without moving your shoulder joint (isometric). Stand or sit in a doorway, facing the door frame. Bend your left / right elbow and place the palm of your hand against the door frame. Only your palm should be touching the frame. Keep your upper arm at your side. Gently press your hand against the door frame, as if you are trying to push your arm toward your abdomen (internal rotation). Press as hard as you are able without pain. Avoid shrugging your shoulder while you press your hand against the door frame. Keep your shoulder blade tucked down toward the middle of your back. Hold for __________ seconds.  Slowly release the tension, and relax your muscles completely before you repeat the exercise. Repeat __________ times. Complete this exercise __________ times a day. Scapular protraction, supine  Lie on your back on a firm surface (supine position). Hold a __________ weight in your left / right hand. Raise your left / right arm straight into the air so your hand is directly above  your shoulder joint. Push the weight into the air so your shoulder (scapula) lifts off the surface that you are lying on. The scapula will push up or forward (protraction). Do not move your head, neck, or back. Hold for __________ seconds. Slowly return to the starting position. Let your muscles relax completely before you repeat this exercise. Repeat __________ times. Complete this exercise __________ times a day. Scapular retraction  Sit in a stable chair without armrests, or stand up. Secure an exercise band to a stable object in front of you so the band is at shoulder height. Hold one end of the exercise band in each hand. Your palms should face down. Squeeze your shoulder blades together (retraction) and move your elbows slightly behind you. Do not shrug your shoulders upward while you do this. Hold for __________ seconds. Slowly return to the starting position. Repeat __________ times. Complete this exercise __________ times a day. Shoulder extension  Sit in a stable chair without armrests, or stand up. Secure an exercise band to a stable object in front of you so the band is above shoulder height. Hold one end of the exercise band in each hand. Straighten your elbows and lift your hands up to shoulder height. Squeeze your shoulder blades together and pull your hands down to the sides of your thighs (extension). Stop when your hands are straight down by your sides. Do not let your hands go behind your body. Hold for __________ seconds. Slowly return to the starting position. Repeat __________ times. Complete this exercise __________ times a day. This information is not intended to replace advice given to you by your health care provider. Make sure you discuss any questions you have with your health care provider. Document Revised: 12/07/2018 Document Reviewed: 09/10/2018 Elsevier Patient Education  [place].

## 2021-09-01 ENCOUNTER — Other Ambulatory Visit: Payer: Self-pay | Admitting: Family Medicine

## 2021-09-01 DIAGNOSIS — N529 Male erectile dysfunction, unspecified: Secondary | ICD-10-CM

## 2021-09-01 MED ORDER — SILDENAFIL CITRATE 50 MG PO TABS: 50.0000 mg | ORAL_TABLET | Freq: Every day | ORAL | 0 refills | Status: DC | PRN

## 2021-10-28 ENCOUNTER — Ambulatory Visit: Payer: BC Managed Care – PPO | Admitting: Dermatology

## 2021-10-28 ENCOUNTER — Other Ambulatory Visit: Payer: Self-pay

## 2021-10-28 DIAGNOSIS — L219 Seborrheic dermatitis, unspecified: Secondary | ICD-10-CM | POA: Diagnosis not present

## 2021-10-28 DIAGNOSIS — L280 Lichen simplex chronicus: Secondary | ICD-10-CM

## 2021-10-28 DIAGNOSIS — L821 Other seborrheic keratosis: Secondary | ICD-10-CM

## 2021-10-28 DIAGNOSIS — Z1283 Encounter for screening for malignant neoplasm of skin: Secondary | ICD-10-CM

## 2021-10-28 DIAGNOSIS — B360 Pityriasis versicolor: Secondary | ICD-10-CM | POA: Diagnosis not present

## 2021-10-28 DIAGNOSIS — L578 Other skin changes due to chronic exposure to nonionizing radiation: Secondary | ICD-10-CM

## 2021-10-28 DIAGNOSIS — D229 Melanocytic nevi, unspecified: Secondary | ICD-10-CM

## 2021-10-28 DIAGNOSIS — L570 Actinic keratosis: Secondary | ICD-10-CM

## 2021-10-28 DIAGNOSIS — L820 Inflamed seborrheic keratosis: Secondary | ICD-10-CM | POA: Diagnosis not present

## 2021-10-28 DIAGNOSIS — L814 Other melanin hyperpigmentation: Secondary | ICD-10-CM

## 2021-10-28 DIAGNOSIS — L918 Other hypertrophic disorders of the skin: Secondary | ICD-10-CM

## 2021-10-28 DIAGNOSIS — D1800 Hemangioma unspecified site: Secondary | ICD-10-CM

## 2021-10-28 DIAGNOSIS — Z85828 Personal history of other malignant neoplasm of skin: Secondary | ICD-10-CM

## 2021-10-28 MED ORDER — HYDROCORTISONE 2.5 % EX LOTN: TOPICAL_LOTION | CUTANEOUS | 6 refills | Status: DC

## 2021-10-28 MED ORDER — KETOCONAZOLE 2 % EX CREA: 1.0000 "application " | TOPICAL_CREAM | CUTANEOUS | 4 refills | Status: AC

## 2021-10-28 NOTE — Progress Notes (Signed)
? ?New Patient Visit ? ?Subjective  ?Philip Reynolds is a 51 y.o. male who presents for the following: Total body skin exam (Hx of BCC of the back txted in past). ? ?New patient referral from Dr. [name]. ? ?The patient presents for Total-Body Skin Exam (TBSE) for skin cancer screening and mole check.  The patient has spots, moles and lesions to be evaluated, some may be new or changing and the patient has concerns that these could be cancer.  ? ?The following portions of the chart were reviewed this encounter and updated as appropriate:  ? Tobacco  Allergies  Meds  Problems  Med Hx  Surg Hx  Fam Hx   ?  ?Review of Systems:  No other skin or systemic complaints except as noted in HPI or Assessment and Plan. ? ?Objective  ?Well appearing patient in no apparent distress; mood and affect are within normal limits. ? ?A full examination was performed including scalp, head, eyes, ears, nose, lips, neck, chest, axillae, abdomen, back, buttocks, bilateral upper extremities, bilateral lower extremities, hands, feet, fingers, toes, fingernails, and toenails. All findings within normal limits unless otherwise noted below. ? ?face x 5, scalp x 5 (10) ?Pink scaly macules ? ?face ?Erythema and scale beard area ? ?L post base of neck ?Lichenification R post base of neck ? ? ? ? ?L forearm x 1, L dorsum hand x 1, L clavicle x 1 (3) ?Stuck on waxy paps with erythema ? ? ?Assessment & Plan  ? ?Lentigines ?- Scattered tan macules ?- Due to sun exposure ?- Benign-appearing, observe ?- Recommend daily broad spectrum sunscreen SPF 30+ to sun-exposed areas, reapply every 2 hours as needed. ?- Call for any changes ? ?Seborrheic Keratoses ?- Stuck-on, waxy, tan-brown papules and/or plaques  ?- Benign-appearing ?- Discussed benign etiology and prognosis. ?- Observe ?- Call for any changes ? ?Melanocytic Nevi ?- Tan-brown and/or pink-flesh-colored symmetric macules and papules ?- Benign appearing on exam today ?-  Observation ?- Call clinic for new or changing moles ?- Recommend daily use of broad spectrum spf 30+ sunscreen to sun-exposed areas.  ? ?Hemangiomas ?- Red papules ?- Discussed benign nature ?- Observe ?- Call for any changes ? ?Actinic Damage ?- Chronic condition, secondary to cumulative UV/sun exposure ?- diffuse scaly erythematous macules with underlying dyspigmentation ?- Recommend daily broad spectrum sunscreen SPF 30+ to sun-exposed areas, reapply every 2 hours as needed.  ?- Staying in the shade or wearing long sleeves, sun glasses (UVA+UVB protection) and wide brim hats (4-inch brim around the entire circumference of the hat) are also recommended for sun protection.  ?- Call for new or changing lesions. ? ?Skin cancer screening performed today. ? ?History of Basal Cell Carcinoma of the Skin ?- No evidence of recurrence today ?- Recommend regular full body skin exams ?- Recommend daily broad spectrum sunscreen SPF 30+ to sun-exposed areas, reapply every 2 hours as needed.  ?- Call if any new or changing lesions are noted between office visits  ?- R back ? ?Acrochordons (Skin Tags) ?- Fleshy, skin-colored pedunculated papules ?- Benign appearing.  ?- Observe. ?- If desired, they can be removed with an in office procedure that is not covered by insurance.  Fee is $115 for up to 15 lesions. ?- Please call the clinic if you notice any new or changing lesions.  ?- Discussed removal on f/u ? ?AK (actinic keratosis) (10) ?face x 5, scalp x 5 ?Destruction of lesion - face x 5, scalp x  5 ?Complexity: simple   ?Destruction method: cryotherapy   ?Informed consent: discussed and consent obtained   ?Timeout:  patient name, date of birth, surgical site, and procedure verified ?Lesion destroyed using liquid nitrogen: Yes   ?Region frozen until ice ball extended beyond lesion: Yes   ?Outcome: patient tolerated procedure well with no complications   ?Post-procedure details: wound care instructions given   ? ?Seborrheic  dermatitis ?face ?Seborrheic Dermatitis  ?-  is a chronic persistent rash characterized by pinkness and scaling most commonly of the mid face but also can occur on the scalp (dandruff), ears; mid chest, mid back and groin.  It tends to be exacerbated by stress and cooler weather.  People who have neurologic disease may experience new onset or exacerbation of existing seborrheic dermatitis.  The condition is not curable but treatable and can be controlled. ? ?Start Ketoconazole 2% cr 3d/wk Monday, Wednesday, Friday ?Start HC 2.5% lotion 3d/wk, Tuesday, Thursday, Saturday ? ?ketoconazole (NIZORAL) 2 % cream - face ?Apply 1 application topically 3 (three) times a week. Apply to itchy scaly rash face and posterior base of neck 3 nights a week Monday, Wednesday, Friday ? ?hydrocortisone 2.5 % lotion - face ?Apply topically 3 (three) times a week. Apply to scaly areas on face and itchy patch on posterior neck 3 nights per week Tuesday, Thursday, [place], prn flares ? ?Tinea versicolor ?L post base of neck ?With Lichen Simplex Chronicus ? ?Start Ketoconazole 2% cr 3d/wk Monday, Wednesday, Friday ?Start HC 2.5% cr 3d/wk, Tuesday, Thursday, Saturday ? ?If not improving may consider bx ? ?Tinea versicolor is a chronic recurrent skin rash causing discolored scaly spots most commonly seen on back, chest, and/or shoulders.  It is generally asymptomatic. The rash is due to overgrowth of a common type of yeast present on everyone's skin and it is not contagious.  It tends to flare more in the summer due to increased sweating on trunk.  After rash is treated, the scaliness will resolve, but the discoloration will take longer to return to normal pigmentation. The periodic use of an OTC medicated soap/shampoo with zinc or selenium sulfide can be helpful to prevent yeast overgrowth and recurrence. ? ?Inflamed seborrheic keratosis (3) ?L forearm x 1, L dorsum hand x 1, L clavicle x 1 ?Destruction of lesion - L forearm x 1, L dorsum  hand x 1, L clavicle x 1 ?Complexity: simple   ?Destruction method: cryotherapy   ?Informed consent: discussed and consent obtained   ?Timeout:  patient name, date of birth, surgical site, and procedure verified ?Lesion destroyed using liquid nitrogen: Yes   ?Region frozen until ice ball extended beyond lesion: Yes   ?Outcome: patient tolerated procedure well with no complications   ?Post-procedure details: wound care instructions given   ? ?Return for 3-73m for AK f/u, Seb derm f/u, and recheck TV with [facility]. ? ?I, Othelia Pulling, RMA, am acting as scribe for Sarina Ser, MD . ?Documentation: I have reviewed the above documentation for accuracy and completeness, and I agree with the above. ? ?Sarina Ser, MD ? ?

## 2021-10-28 NOTE — Patient Instructions (Addendum)
Cryotherapy Aftercare ? ?Wash gently with soap and water everyday.   ?Apply Vaseline and Band-Aid daily until healed.  ? ? ?For Itchy dry patches on face and posterior base of neck ?Start Ketoconazole 2% cream 3 nights a week Monday, Wednesday, Friday ?Start HC 2.5% lotion 3 nights a week, Tuesday, Thursday, Saturday ? ? ?If You Need Anything After Your Visit ? ?If you have any questions or concerns for your doctor, please call our main line at [phone] and press option 4 to reach your doctor's medical assistant. If no one answers, please leave a voicemail as directed and we will return your call as soon as possible. Messages left after 4 pm will be answered the following business day.  ? ?You may also send [place] a message via MyChart. We typically respond to MyChart messages within 1-2 business days. ? ?For prescription refills, please ask your pharmacy to contact our office. Our fax number is [phone]. ? ?If you have an urgent issue when the clinic is closed that cannot wait until the next business day, you can page your doctor at the number below.   ? ?Please note that while we do our best to be available for urgent issues outside of office hours, we are not available 24/7.  ? ?If you have an urgent issue and are unable to reach [place], you may choose to seek medical care at your doctor's office, retail clinic, urgent care center, or emergency room. ? ?If you have a medical emergency, please immediately call 911 or go to the emergency department. ? ?Pager Numbers ? ?- Dr. Nehemiah Massed: [phone] ? ?- Dr. Laurence Ferrari: [phone] ? ?- Dr. Nicole Kindred: [phone] ? ?In the event of inclement weather, please call our main line at [phone] for an update on the status of any delays or closures. ? ?Dermatology Medication Tips: ?Please keep the boxes that topical medications come in in order to help keep track of the instructions about where and how to use these. Pharmacies typically print the medication instructions only  on the boxes and not directly on the medication tubes.  ? ?If your medication is too expensive, please contact our office at [phone] option 4 or send [place] a message through [place].  ? ?We are unable to tell what your co-pay for medications will be in advance as this is different depending on your insurance coverage. However, we may be able to find a substitute medication at lower cost or fill out paperwork to get insurance to cover a needed medication.  ? ?If a prior authorization is required to get your medication covered by your insurance company, please allow [place] 1-2 business days to complete this process. ? ?Drug prices often vary depending on where the prescription is filled and some pharmacies may offer cheaper prices. ? ?The website www.goodrx.com contains coupons for medications through different pharmacies. The prices here do not account for what the cost may be with help from insurance (it may be cheaper with your insurance), but the website can give you the price if you did not use any insurance.  ?- You can print the associated coupon and take it with your prescription to the pharmacy.  ?- You may also stop by our office during regular business hours and pick up a GoodRx coupon card.  ?- If you need your prescription sent electronically to a different pharmacy, notify our office through [facility] or by phone at [phone] option 4. ? ? ? ? ?Si Usted Necesita Algo Despu?s de Su  Visita ? ?Tambi?n puede enviarnos un mensaje a trav?s de MyChart. Por lo general respondemos a los mensajes de MyChart en el transcurso de 1 a 2 d?as h?biles. ? ?Para renovar recetas, por favor pida a su farmacia que se ponga en contacto con nuestra oficina. Nuestro n?mero de fax es el [phone]. ? ?Si tiene un asunto urgente cuando la cl?nica est? cerrada y que no puede esperar hasta el siguiente d?a h?bil, puede llamar/localizar a su doctor(a) al n?mero que aparece a continuaci?n.  ? ?Por favor, tenga en cuenta  que aunque hacemos todo lo posible para estar disponibles para asuntos urgentes fuera del horario de oficina, no estamos disponibles las 24 horas del d?a, los 7 d?as de la semana.  ? ?Si tiene un problema urgente y no puede comunicarse con nosotros, puede optar por buscar atenci?n m?dica  en el consultorio de su doctor(a), en una cl?nica privada, en un centro de atenci?n urgente o en una sala de emergencias. ? ?Si tiene Engineer, maintenance (IT) m?dica, por favor llame inmediatamente al 911 o vaya a la sala de emergencias. ? ?N?meros de b?per ? ?- Dr. Nehemiah Massed: [phone] ? ?- Dra. Moye: [phone] ? ?- Dra. Nicole Kindred: [phone] ? ?En caso de inclemencias del tiempo, por favor llame a nuestra l?nea principal al [phone] para una actualizaci?n sobre el estado de cualquier retraso o cierre. ? ?Consejos para la medicaci?n en dermatolog?a: ?Por favor, guarde las cajas en las que vienen los medicamentos de uso t?pico para ayudarle a seguir las instrucciones sobre d?nde y c?mo usarlos. Las farmacias generalmente imprimen las instrucciones del medicamento s?lo en las cajas y no directamente en los tubos del [place].  ? ?Si su medicamento es muy caro, por favor, p?ngase en contacto con Zigmund Daniel llamando al [phone] y presione la opci?n 4 o env?enos un mensaje a trav?s de MyChart.  ? ?No podemos decirle cu?l ser? su copago por los medicamentos por adelantado ya que esto es diferente dependiendo de la cobertura de su seguro. Sin embargo, es posible que podamos encontrar un medicamento sustituto a Electrical engineer un formulario para que el seguro cubra el medicamento que se considera necesario.  ? ?Si se requiere Ardelia Mems autorizaci?n previa para que su compa??a de seguros [place] su medicamento, por favor perm?tanos de 1 a 2 d?as h?biles para completar este proceso. ? ?Los precios de los medicamentos var?an con frecuencia dependiendo del Environmental consultant de d?nde se surte la receta y alguna farmacias pueden ofrecer precios m?s  baratos. ? ?El sitio web www.goodrx.com tiene cupones para medicamentos de Airline pilot. Los precios aqu? no tienen en cuenta lo que podr?a costar con la ayuda del seguro (puede ser m?s barato con su seguro), pero el sitio web puede darle el precio si no utiliz? ning?n seguro.  ?- Puede imprimir el cup?n correspondiente y llevarlo con su receta a la farmacia.  ?- Tambi?n puede pasar por nuestra oficina durante el horario de atenci?n regular y recoger una tarjeta de cupones de GoodRx.  ?- Si necesita que su receta se env?e electr?nicamente a Chiropodist, informe a nuestra oficina a trav?s de MyChart de [place] o por tel?fono llamando al [phone] y presione la opci?n 4.  ?

## 2021-10-30 ENCOUNTER — Encounter: Payer: Self-pay | Admitting: Dermatology

## 2022-01-18 IMAGING — DX DG SHOULDER 2+V*L*
3 series · 3 of 3 positions shown · non-contrast
Comparison: None.

CLINICAL DATA: Shoulder pain.

EXAM:
LEFT SHOULDER - 2+ VIEW

[shoulder (grashey) ap]
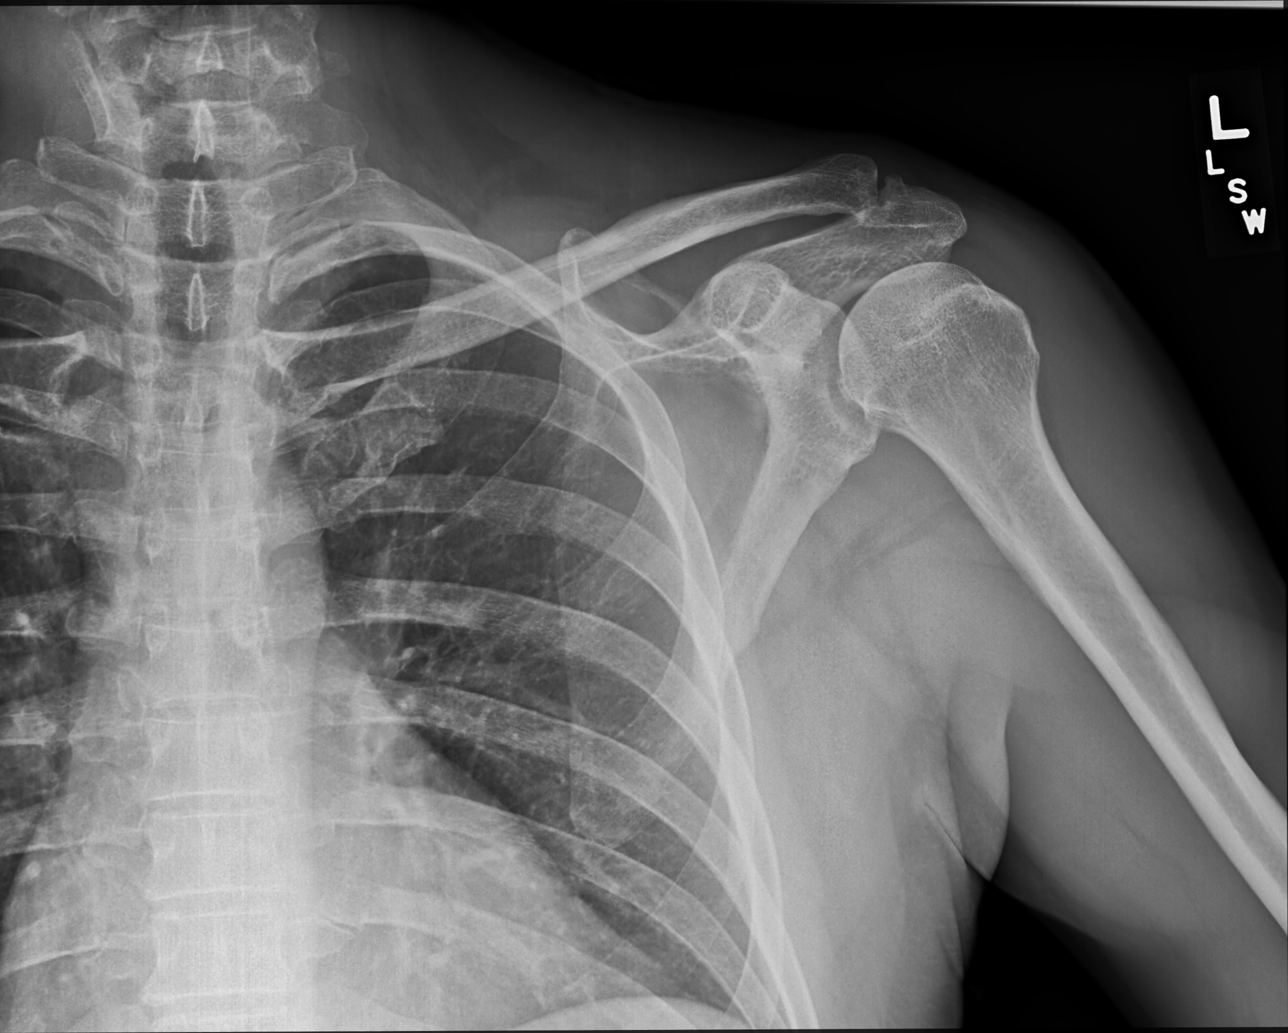

[shoulder y view]
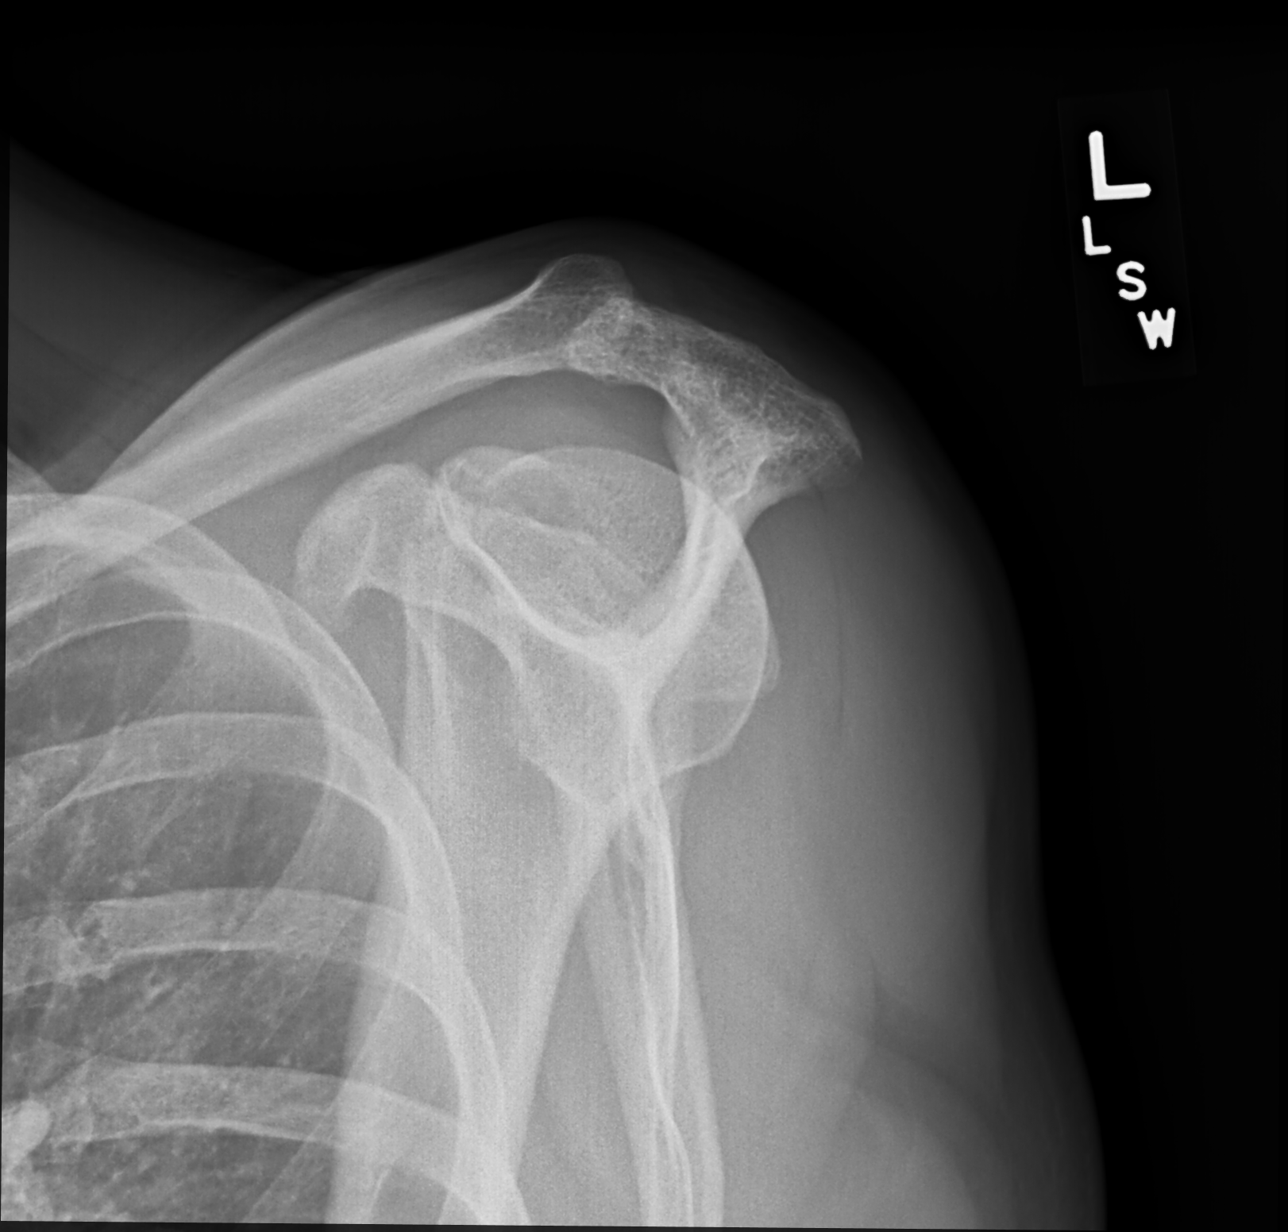

[shoulder axillary pa]
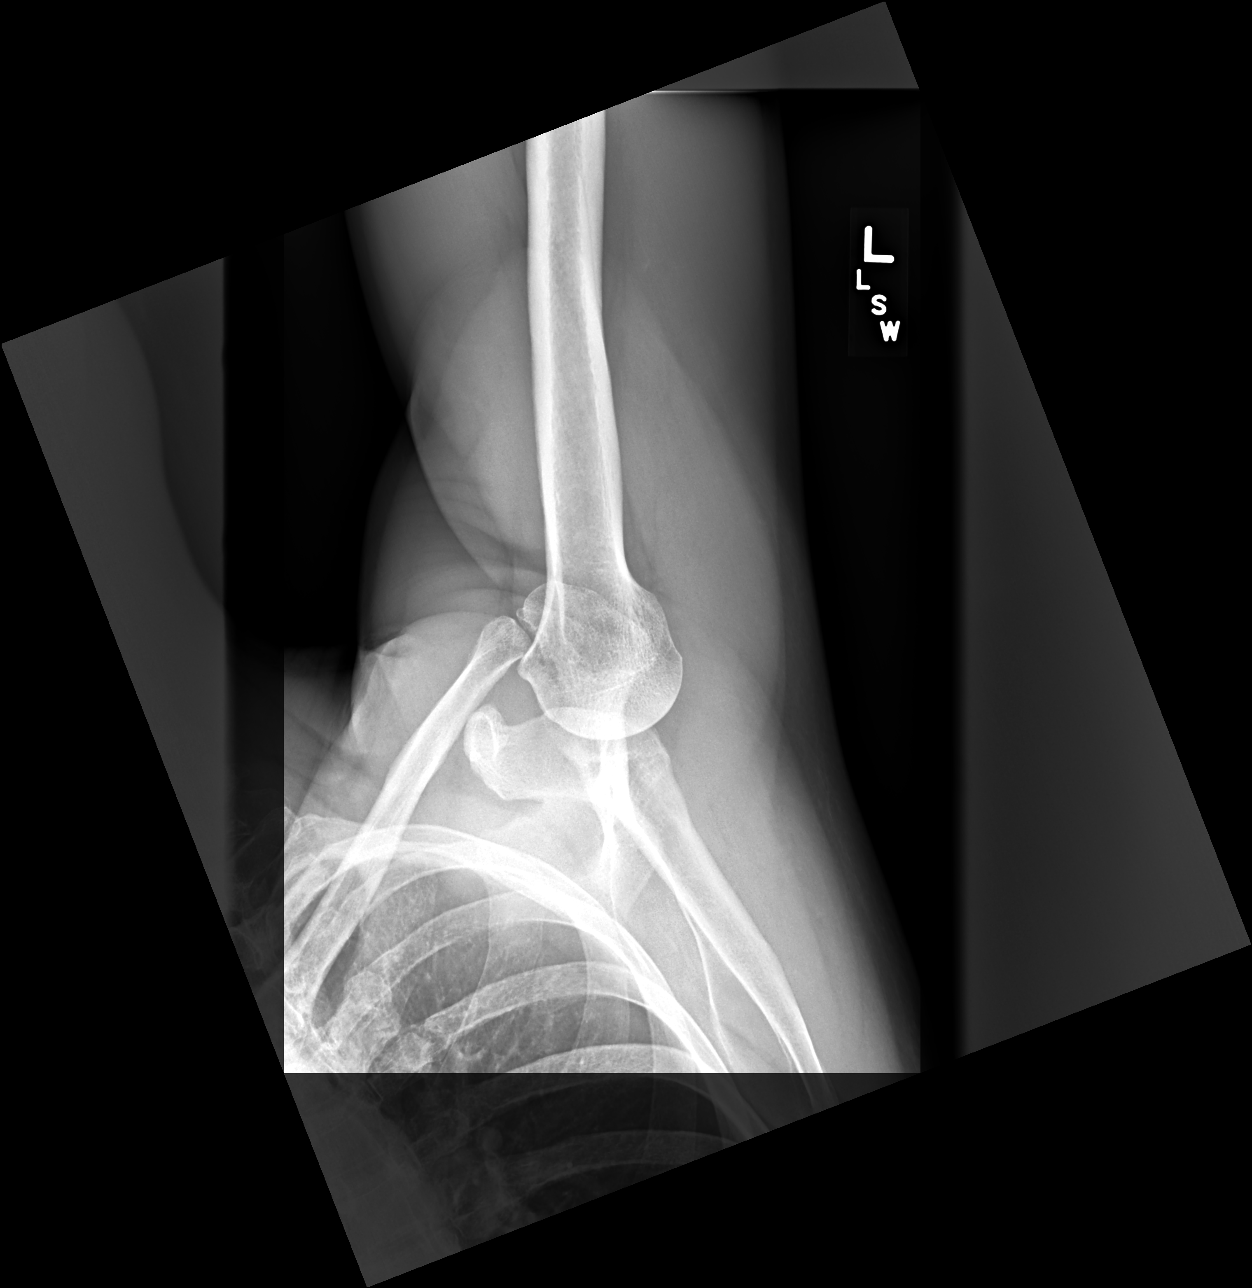

[3 of 3 positions shown; findings below may reference images not displayed]

FINDINGS: There is no evidence of fracture or dislocation. There are mild
degenerative changes of the acromioclavicular joint with joint space
narrowing and osteophyte formation. Glenohumeral joint space is
maintained. No focal osseous lesions are seen. Soft tissues are
unremarkable.
IMPRESSION: 1. No acute bony abnormality.
2. Mild degenerative changes of the acromioclavicular joint.

## 2022-02-16 ENCOUNTER — Ambulatory Visit: Payer: BC Managed Care – PPO | Admitting: Dermatology

## 2022-02-16 DIAGNOSIS — C4441 Basal cell carcinoma of skin of scalp and neck: Secondary | ICD-10-CM | POA: Diagnosis not present

## 2022-02-16 DIAGNOSIS — D1800 Hemangioma unspecified site: Secondary | ICD-10-CM

## 2022-02-16 DIAGNOSIS — Z1283 Encounter for screening for malignant neoplasm of skin: Secondary | ICD-10-CM | POA: Diagnosis not present

## 2022-02-16 DIAGNOSIS — L821 Other seborrheic keratosis: Secondary | ICD-10-CM

## 2022-02-16 DIAGNOSIS — D2271 Melanocytic nevi of right lower limb, including hip: Secondary | ICD-10-CM | POA: Diagnosis not present

## 2022-02-16 DIAGNOSIS — Z85828 Personal history of other malignant neoplasm of skin: Secondary | ICD-10-CM

## 2022-02-16 DIAGNOSIS — D489 Neoplasm of uncertain behavior, unspecified: Secondary | ICD-10-CM

## 2022-02-16 DIAGNOSIS — L219 Seborrheic dermatitis, unspecified: Secondary | ICD-10-CM | POA: Diagnosis not present

## 2022-02-16 DIAGNOSIS — B360 Pityriasis versicolor: Secondary | ICD-10-CM

## 2022-02-16 DIAGNOSIS — R21 Rash and other nonspecific skin eruption: Secondary | ICD-10-CM

## 2022-02-16 DIAGNOSIS — L814 Other melanin hyperpigmentation: Secondary | ICD-10-CM

## 2022-02-16 DIAGNOSIS — L570 Actinic keratosis: Secondary | ICD-10-CM | POA: Diagnosis not present

## 2022-02-16 DIAGNOSIS — D229 Melanocytic nevi, unspecified: Secondary | ICD-10-CM

## 2022-02-16 DIAGNOSIS — L578 Other skin changes due to chronic exposure to nonionizing radiation: Secondary | ICD-10-CM

## 2022-02-16 DIAGNOSIS — C4491 Basal cell carcinoma of skin, unspecified: Secondary | ICD-10-CM

## 2022-02-16 DIAGNOSIS — D492 Neoplasm of unspecified behavior of bone, soft tissue, and skin: Secondary | ICD-10-CM

## 2022-02-16 HISTORY — DX: Basal cell carcinoma of skin, unspecified: C44.91

## 2022-02-16 NOTE — Patient Instructions (Addendum)
Due to recent changes in healthcare laws, you may see results of your pathology and/or laboratory studies on MyChart before the doctors have had a chance to review them. We understand that in some cases there may be results that are confusing or concerning to you. Please understand that not all results are received at the same time and often the doctors may need to interpret multiple results in order to provide you with the best plan of care or course of treatment. Therefore, we ask that you please give us 2 business days to thoroughly review all your results before contacting the office for clarification. Should we see a critical lab result, you will be contacted sooner.   If You Need Anything After Your Visit  If you have any questions or concerns for your doctor, please call our main line at [phone] and press option 4 to reach your doctor's medical assistant. If no one answers, please leave a voicemail as directed and we will return your call as soon as possible. Messages left after 4 pm will be answered the following business day.   You may also send us a message via MyChart. We typically respond to MyChart messages within 1-2 business days.  For prescription refills, please ask your pharmacy to contact our office. Our fax number is [phone].  If you have an urgent issue when the clinic is closed that cannot wait until the next business day, you can page your doctor at the number below.    Please note that while we do our best to be available for urgent issues outside of office hours, we are not available 24/7.   If you have an urgent issue and are unable to reach us, you may choose to seek medical care at your doctor's office, retail clinic, urgent care center, or emergency room.  If you have a medical emergency, please immediately call 911 or go to the emergency department.  Pager Numbers  - Dr. Kowalski: [phone]  - Dr. Moye: [phone]  - Dr. Stewart:  [phone]  In the event of inclement weather, please call our main line at [phone] for an update on the status of any delays or closures.  Dermatology Medication Tips: Please keep the boxes that topical medications come in in order to help keep track of the instructions about where and how to use these. Pharmacies typically print the medication instructions only on the boxes and not directly on the medication tubes.   If your medication is too expensive, please contact our office at [phone] option 4 or send us a message through MyChart.   We are unable to tell what your co-pay for medications will be in advance as this is different depending on your insurance coverage. However, we may be able to find a substitute medication at lower cost or fill out paperwork to get insurance to cover a needed medication.   If a prior authorization is required to get your medication covered by your insurance company, please allow us 1-2 business days to complete this process.  Drug prices often vary depending on where the prescription is filled and some pharmacies may offer cheaper prices.  The website www.goodrx.com contains coupons for medications through different pharmacies. The prices here do not account for what the cost may be with help from insurance (it may be cheaper with your insurance), but the website can give you the price if you did not use any insurance.  - You can print the associated coupon and take it with   your prescription to the pharmacy.  - You may also stop by our office during regular business hours and pick up a GoodRx coupon card.  - If you need your prescription sent electronically to a different pharmacy, notify our office through [facility] MyChart or by phone at [phone] option 4.     Si Usted Necesita Algo Despus de Su Visita  Tambin puede enviarnos un mensaje a travs de MyChart. Por lo general respondemos a los mensajes de MyChart en el transcurso de 1 a 2  das hbiles.  Para renovar recetas, por favor pida a su farmacia que se ponga en contacto con nuestra oficina. Nuestro nmero de fax es el [phone].  Si tiene un asunto urgente cuando la clnica est cerrada y que no puede esperar hasta el siguiente da hbil, puede llamar/localizar a su doctor(a) al nmero que aparece a continuacin.   Por favor, tenga en cuenta que aunque hacemos todo lo posible para estar disponibles para asuntos urgentes fuera del horario de oficina, no estamos disponibles las 24 horas del da, los 7 das de la semana.   Si tiene un problema urgente y no puede comunicarse con nosotros, puede optar por buscar atencin mdica  en el consultorio de su doctor(a), en una clnica privada, en un centro de atencin urgente o en una sala de emergencias.  Si tiene una emergencia mdica, por favor llame inmediatamente al 911 o vaya a la sala de emergencias.  Nmeros de bper  - Dr. Kowalski: [phone]  - Dra. Moye: [phone]  - Dra. Stewart: [phone]  En caso de inclemencias del tiempo, por favor llame a nuestra lnea principal al [phone] para una actualizacin sobre el estado de cualquier retraso o cierre.  Consejos para la medicacin en dermatologa: Por favor, guarde las cajas en las que vienen los medicamentos de uso tpico para ayudarle a seguir las instrucciones sobre dnde y cmo usarlos. Las farmacias generalmente imprimen las instrucciones del medicamento slo en las cajas y no directamente en los tubos del medicamento.   Si su medicamento es muy caro, por favor, pngase en contacto con nuestra oficina llamando al [phone] y presione la opcin 4 o envenos un mensaje a travs de MyChart.   No podemos decirle cul ser su copago por los medicamentos por adelantado ya que esto es diferente dependiendo de la cobertura de su seguro. Sin embargo, es posible que podamos encontrar un medicamento sustituto a menor costo o llenar un formulario para que el  seguro cubra el medicamento que se considera necesario.   Si se requiere una autorizacin previa para que su compaa de seguros cubra su medicamento, por favor permtanos de 1 a 2 das hbiles para completar este proceso.  Los precios de los medicamentos varan con frecuencia dependiendo del lugar de dnde se surte la receta y alguna farmacias pueden ofrecer precios ms baratos.  El sitio web www.goodrx.com tiene cupones para medicamentos de diferentes farmacias. Los precios aqu no tienen en cuenta lo que podra costar con la ayuda del seguro (puede ser ms barato con su seguro), pero el sitio web puede darle el precio si no utiliz ningn seguro.  - Puede imprimir el cupn correspondiente y llevarlo con su receta a la farmacia.  - Tambin puede pasar por nuestra oficina durante el horario de atencin regular y recoger una tarjeta de cupones de GoodRx.  - Si necesita que su receta se enve electrnicamente a una farmacia diferente, informe a nuestra oficina a travs de MyChart de [place]   o por telfono llamando al [phone] y presione la opcin 4.      Due to recent changes in healthcare laws, you may see results of your pathology and/or laboratory studies on MyChart before the doctors have had a chance to review them. We understand that in some cases there may be results that are confusing or concerning to you. Please understand that not all results are received at the same time and often the doctors may need to interpret multiple results in order to provide you with the best plan of care or course of treatment. Therefore, we ask that you please give us 2 business days to thoroughly review all your results before contacting the office for clarification. Should we see a critical lab result, you will be contacted sooner.   If You Need Anything After Your Visit  If you have any questions or concerns for your doctor, please call our main line at [phone] and press option 4 to reach your  doctor's medical assistant. If no one answers, please leave a voicemail as directed and we will return your call as soon as possible. Messages left after 4 pm will be answered the following business day.   You may also send us a message via MyChart. We typically respond to MyChart messages within 1-2 business days.  For prescription refills, please ask your pharmacy to contact our office. Our fax number is [phone].  If you have an urgent issue when the clinic is closed that cannot wait until the next business day, you can page your doctor at the number below.    Please note that while we do our best to be available for urgent issues outside of office hours, we are not available 24/7.   If you have an urgent issue and are unable to reach us, you may choose to seek medical care at your doctor's office, retail clinic, urgent care center, or emergency room.  If you have a medical emergency, please immediately call 911 or go to the emergency department.  Pager Numbers  - Dr. Kowalski: [phone]  - Dr. Moye: [phone]  - Dr. Stewart: [phone]  In the event of inclement weather, please call our main line at [phone] for an update on the status of any delays or closures.  Dermatology Medication Tips: Please keep the boxes that topical medications come in in order to help keep track of the instructions about where and how to use these. Pharmacies typically print the medication instructions only on the boxes and not directly on the medication tubes.   If your medication is too expensive, please contact our office at [phone] option 4 or send us a message through MyChart.   We are unable to tell what your co-pay for medications will be in advance as this is different depending on your insurance coverage. However, we may be able to find a substitute medication at lower cost or fill out paperwork to get insurance to cover a needed medication.   If a prior authorization is  required to get your medication covered by your insurance company, please allow us 1-2 business days to complete this process.  Drug prices often vary depending on where the prescription is filled and some pharmacies may offer cheaper prices.  The website www.goodrx.com contains coupons for medications through different pharmacies. The prices here do not account for what the cost may be with help from insurance (it may be cheaper with your insurance), but the website can give you the price if you   did not use any insurance.  - You can print the associated coupon and take it with your prescription to the pharmacy.  - You may also stop by our office during regular business hours and pick up a GoodRx coupon card.  - If you need your prescription sent electronically to a different pharmacy, notify our office through [facility] MyChart or by phone at [phone] option 4.     Si Usted Necesita Algo Despus de Su Visita  Tambin puede enviarnos un mensaje a travs de MyChart. Por lo general respondemos a los mensajes de MyChart en el transcurso de 1 a 2 das hbiles.  Para renovar recetas, por favor pida a su farmacia que se ponga en contacto con nuestra oficina. Nuestro nmero de fax es el [phone].  Si tiene un asunto urgente cuando la clnica est cerrada y que no puede esperar hasta el siguiente da hbil, puede llamar/localizar a su doctor(a) al nmero que aparece a continuacin.   Por favor, tenga en cuenta que aunque hacemos todo lo posible para estar disponibles para asuntos urgentes fuera del horario de oficina, no estamos disponibles las 24 horas del da, los 7 das de la semana.   Si tiene un problema urgente y no puede comunicarse con nosotros, puede optar por buscar atencin mdica  en el consultorio de su doctor(a), en una clnica privada, en un centro de atencin urgente o en una sala de emergencias.  Si tiene una emergencia mdica, por favor llame inmediatamente al 911 o vaya a  la sala de emergencias.  Nmeros de bper  - Dr. Kowalski: [phone]  - Dra. Moye: [phone]  - Dra. Stewart: [phone]  En caso de inclemencias del tiempo, por favor llame a nuestra lnea principal al [phone] para una actualizacin sobre el estado de cualquier retraso o cierre.  Consejos para la medicacin en dermatologa: Por favor, guarde las cajas en las que vienen los medicamentos de uso tpico para ayudarle a seguir las instrucciones sobre dnde y cmo usarlos. Las farmacias generalmente imprimen las instrucciones del medicamento slo en las cajas y no directamente en los tubos del medicamento.   Si su medicamento es muy caro, por favor, pngase en contacto con nuestra oficina llamando al [phone] y presione la opcin 4 o envenos un mensaje a travs de MyChart.   No podemos decirle cul ser su copago por los medicamentos por adelantado ya que esto es diferente dependiendo de la cobertura de su seguro. Sin embargo, es posible que podamos encontrar un medicamento sustituto a menor costo o llenar un formulario para que el seguro cubra el medicamento que se considera necesario.   Si se requiere una autorizacin previa para que su compaa de seguros cubra su medicamento, por favor permtanos de 1 a 2 das hbiles para completar este proceso.  Los precios de los medicamentos varan con frecuencia dependiendo del lugar de dnde se surte la receta y alguna farmacias pueden ofrecer precios ms baratos.  El sitio web www.goodrx.com tiene cupones para medicamentos de diferentes farmacias. Los precios aqu no tienen en cuenta lo que podra costar con la ayuda del seguro (puede ser ms barato con su seguro), pero el sitio web puede darle el precio si no utiliz ningn seguro.  - Puede imprimir el cupn correspondiente y llevarlo con su receta a la farmacia.  - Tambin puede pasar por nuestra oficina durante el horario de atencin regular y recoger una tarjeta de cupones de  GoodRx.  - Si necesita que su receta se   enve electrnicamente a una farmacia diferente, informe a nuestra oficina a travs de MyChart de [facility] o por telfono llamando al [phone] y presione la opcin 4.  

## 2022-02-16 NOTE — Progress Notes (Signed)
Follow-Up Visit   Subjective  Philip Reynolds is a 51 y.o. male who presents for the following: Follow-up (3 months f/u Aks on the face and scalp ) and Dermatitis (3 months f/u rash on the face treating with Ketoconazole cream alternating with Hydrocortisone cream). The patient presents for Total-Body Skin Exam (TBSE) for skin cancer screening and mole check.  The patient has spots, moles and lesions to be evaluated, some may be new or changing and the patient has concerns that these could be cancer.   The following portions of the chart were reviewed this encounter and updated as appropriate:   Tobacco  Allergies  Meds  Problems  Med Hx  Surg Hx  Fam Hx     Review of Systems:  No other skin or systemic complaints except as noted in HPI or Assessment and Plan.  Objective  Well appearing patient in no apparent distress; mood and affect are within normal limits.  A full examination was performed including scalp, head, eyes, ears, nose, lips, neck, chest, axillae, abdomen, back, buttocks, bilateral upper extremities, bilateral lower extremities, hands, feet, fingers, toes, fingernails, and toenails. All findings within normal limits unless otherwise noted below.  Head - Anterior (Face) Pink patches with greasy scale.   Neck - Posterior Hypopigmented macules   left cheek x 1 Erythematous thin papules/macules with gritty scale.   right 5th toe 0.8 x 0.5 cm hyperpigmented macule        right post lateral neck 1.5 x 1.0 cm non mobile firm papule   right post base of neck 2.0 cm hypopigmented macule      Left Flank Pink patch   Assessment & Plan  Seborrheic dermatitis Head - Anterior (Face) Seborrheic Dermatitis  -  is a chronic persistent rash characterized by pinkness and scaling most commonly of the mid face but also can occur on the scalp (dandruff), ears; mid chest, mid back and groin.  It tends to be exacerbated by stress and cooler weather.  People who  have neurologic disease may experience new onset or exacerbation of existing seborrheic dermatitis.  The condition is not curable but treatable and can be controlled.  Cont Ketoconazole 2% cr 3d/wk Monday, Wednesday, Friday Cont HC 2.5% lotion 3d/wk, Tuesday, Thursday, Saturday  Related Medications hydrocortisone 2.5 % lotion Apply topically 3 (three) times a week. Apply to scaly areas on face and itchy patch on posterior neck 3 nights per week Tuesday, Thursday, [place], prn flares  AK (actinic keratosis) left cheek x 1 Actinic keratoses are precancerous spots that appear secondary to cumulative UV radiation exposure/sun exposure over time. They are chronic with expected duration over 1 year. A portion of actinic keratoses will progress to squamous cell carcinoma of the skin. It is not possible to reliably predict which spots will progress to skin cancer and so treatment is recommended to prevent development of skin cancer.  Recommend daily broad spectrum sunscreen SPF 30+ to sun-exposed areas, reapply every 2 hours as needed.  Recommend staying in the shade or wearing long sleeves, sun glasses (UVA+UVB protection) and wide brim hats (4-inch brim around the entire circumference of the hat). Call for new or changing lesions.   Destruction of lesion - left cheek x 1 Complexity: simple   Destruction method: cryotherapy   Informed consent: discussed and consent obtained   Timeout:  patient name, date of birth, surgical site, and procedure verified Lesion destroyed using liquid nitrogen: Yes   Region frozen until ice ball extended beyond  lesion: Yes   Outcome: patient tolerated procedure well with no complications   Post-procedure details: wound care instructions given    Nevus right 5th toe See photo Benign-appearing.  Observation.  Call clinic for new or changing lesions.  Recommend daily use of broad spectrum spf 30+ sunscreen to sun-exposed areas.    Neoplasm of skin right post  lateral neck Suspect swollen lymph node - present for 2 years? Recommend consult with PCP   Neoplasm of uncertain behavior right post base of neck Skin / nail biopsy Type of biopsy: tangential   Informed consent: discussed and consent obtained   Patient was prepped and draped in usual sterile fashion: area prepped with alochol. Anesthesia: the lesion was anesthetized in a standard fashion   Anesthetic:  1% lidocaine w/ epinephrine 1-100,000 buffered w/ 8.4% NaHCO3 Instrument used: flexible razor blade   Hemostasis achieved with: pressure, aluminum chloride and electrodesiccation   Outcome: patient tolerated procedure well   Post-procedure details: wound care instructions given   Post-procedure details comment:  Ointment and small bandage  Specimen 1 - Surgical pathology Differential Diagnosis: R.O Skin cancer Check Margins: No  Rash - eczema Left Flank Pt declines treatment today   Lentigines - Scattered tan macules - Due to sun exposure - Benign-appearing, observe - Recommend daily broad spectrum sunscreen SPF 30+ to sun-exposed areas, reapply every 2 hours as needed. - Call for any changes  Seborrheic Keratoses - Stuck-on, waxy, tan-brown papules and/or plaques  - Benign-appearing - Discussed benign etiology and prognosis. - Observe - Call for any changes  Melanocytic Nevi - Tan-brown and/or pink-flesh-colored symmetric macules and papules - Benign appearing on exam today - Observation - Call clinic for new or changing moles - Recommend daily use of broad spectrum spf 30+ sunscreen to sun-exposed areas.   Hemangiomas - Red papules - Discussed benign nature - Observe - Call for any changes  Actinic Damage - Chronic condition, secondary to cumulative UV/sun exposure - diffuse scaly erythematous macules with underlying dyspigmentation - Recommend daily broad spectrum sunscreen SPF 30+ to sun-exposed areas, reapply every 2 hours as needed.  - Staying in the  shade or wearing long sleeves, sun glasses (UVA+UVB protection) and wide brim hats (4-inch brim around the entire circumference of the hat) are also recommended for sun protection.  - Call for new or changing lesions.  History of Basal Cell Carcinoma of the Skin back - No evidence of recurrence today - Recommend regular full body skin exams - Recommend daily broad spectrum sunscreen SPF 30+ to sun-exposed areas, reapply every 2 hours as needed.  - Call if any new or changing lesions are noted between office visits   Skin cancer screening performed today.   Return in about 8 months (around 10/19/2022) for AKs, Seb derm .  IMarye Round, CMA, am acting as scribe for Sarina Ser, MD .  Documentation: I have reviewed the above documentation for accuracy and completeness, and I agree with the above.  Sarina Ser, MD

## 2022-02-17 ENCOUNTER — Encounter: Payer: Self-pay | Admitting: Dermatology

## 2022-02-17 ENCOUNTER — Telehealth: Payer: Self-pay

## 2022-02-17 NOTE — Telephone Encounter (Signed)
Patient advised of BX results and scheduled for EDC. aw 

## 2022-02-21 ENCOUNTER — Encounter: Payer: Self-pay | Admitting: Family Medicine

## 2022-02-21 ENCOUNTER — Ambulatory Visit: Payer: BC Managed Care – PPO | Admitting: Family Medicine

## 2022-02-21 VITALS — BP 120/78 | HR 104 | Temp 97.7°F | Ht 72.0 in | Wt 201.6 lb

## 2022-02-21 DIAGNOSIS — C44519 Basal cell carcinoma of skin of other part of trunk: Secondary | ICD-10-CM

## 2022-02-21 DIAGNOSIS — R221 Localized swelling, mass and lump, neck: Secondary | ICD-10-CM

## 2022-02-21 DIAGNOSIS — R229 Localized swelling, mass and lump, unspecified: Secondary | ICD-10-CM | POA: Diagnosis not present

## 2022-02-21 LAB — BASIC METABOLIC PANEL
BUN: 14 mg/dL (ref 6–23)
CO2: 28 mEq/L (ref 19–32)
Calcium: 9.9 mg/dL (ref 8.4–10.5)
Chloride: 102 mEq/L (ref 96–112)
Creatinine, Ser: 0.98 mg/dL (ref 0.40–1.50)
GFR: 91 mL/min (ref >60.00)
Glucose, Bld: 156 mg/dL — ABNORMAL HIGH (ref 70–99)
Potassium: 4.7 mEq/L (ref 3.5–5.1)
Sodium: 140 mEq/L (ref 135–145)

## 2022-02-21 NOTE — Assessment & Plan Note (Signed)
He will continue to follow with dermatology. 

## 2022-02-21 NOTE — Assessment & Plan Note (Signed)
Chronic lesion.  Possibly enlarged recently.  We will proceed with CT imaging of his neck.  BMP today to check renal function.

## 2022-02-22 ENCOUNTER — Other Ambulatory Visit: Payer: Self-pay | Admitting: Family Medicine

## 2022-02-22 DIAGNOSIS — R7309 Other abnormal glucose: Secondary | ICD-10-CM

## 2022-02-23 ENCOUNTER — Other Ambulatory Visit (INDEPENDENT_AMBULATORY_CARE_PROVIDER_SITE_OTHER): Payer: BC Managed Care – PPO

## 2022-02-23 ENCOUNTER — Ambulatory Visit
Admission: RE | Admit: 2022-02-23 | Discharge: 2022-02-23 | Disposition: A | Discharge status: Home / Self Care | Payer: BC Managed Care – PPO | Source: Ambulatory Visit | Attending: Family Medicine | Admitting: Family Medicine

## 2022-02-23 DIAGNOSIS — R7309 Other abnormal glucose: Secondary | ICD-10-CM

## 2022-02-23 DIAGNOSIS — R221 Localized swelling, mass and lump, neck: Secondary | ICD-10-CM | POA: Insufficient documentation

## 2022-02-23 DIAGNOSIS — M47812 Spondylosis without myelopathy or radiculopathy, cervical region: Secondary | ICD-10-CM | POA: Diagnosis not present

## 2022-02-23 LAB — HEMOGLOBIN A1C: Hgb A1c MFr Bld: 5.4 % (ref 4.6–6.5)

## 2022-02-23 MED ORDER — IOHEXOL 300 MG/ML  SOLN
75.0000 mL | Freq: Once | INTRAMUSCULAR | Status: AC | PRN
  Administered 2022-02-23: 75 mL via INTRAVENOUS

## 2022-03-23 ENCOUNTER — Ambulatory Visit: Payer: BC Managed Care – PPO | Admitting: Dermatology

## 2022-03-23 DIAGNOSIS — R599 Enlarged lymph nodes, unspecified: Secondary | ICD-10-CM

## 2022-03-23 DIAGNOSIS — C4441 Basal cell carcinoma of skin of scalp and neck: Secondary | ICD-10-CM

## 2022-03-23 DIAGNOSIS — L578 Other skin changes due to chronic exposure to nonionizing radiation: Secondary | ICD-10-CM

## 2022-03-23 NOTE — Patient Instructions (Signed)
Due to recent changes in healthcare laws, you may see results of your pathology and/or laboratory studies on MyChart before the doctors have had a chance to review them. We understand that in some cases there may be results that are confusing or concerning to you. Please understand that not all results are received at the same time and often the doctors may need to interpret multiple results in order to provide you with the best plan of care or course of treatment. Therefore, we ask that you please give us 2 business days to thoroughly review all your results before contacting the office for clarification. Should we see a critical lab result, you will be contacted sooner.   If You Need Anything After Your Visit  If you have any questions or concerns for your doctor, please call our main line at [phone] and press option 4 to reach your doctor's medical assistant. If no one answers, please leave a voicemail as directed and we will return your call as soon as possible. Messages left after 4 pm will be answered the following business day.   You may also send us a message via MyChart. We typically respond to MyChart messages within 1-2 business days.  For prescription refills, please ask your pharmacy to contact our office. Our fax number is [phone].  If you have an urgent issue when the clinic is closed that cannot wait until the next business day, you can page your doctor at the number below.    Please note that while we do our best to be available for urgent issues outside of office hours, we are not available 24/7.   If you have an urgent issue and are unable to reach us, you may choose to seek medical care at your doctor's office, retail clinic, urgent care center, or emergency room.  If you have a medical emergency, please immediately call 911 or go to the emergency department.  Pager Numbers  - Dr. Kowalski: [phone]  - Dr. Moye: [phone]  - Dr. Stewart:  [phone]  In the event of inclement weather, please call our main line at [phone] for an update on the status of any delays or closures.  Dermatology Medication Tips: Please keep the boxes that topical medications come in in order to help keep track of the instructions about where and how to use these. Pharmacies typically print the medication instructions only on the boxes and not directly on the medication tubes.   If your medication is too expensive, please contact our office at [phone] option 4 or send us a message through MyChart.   We are unable to tell what your co-pay for medications will be in advance as this is different depending on your insurance coverage. However, we may be able to find a substitute medication at lower cost or fill out paperwork to get insurance to cover a needed medication.   If a prior authorization is required to get your medication covered by your insurance company, please allow us 1-2 business days to complete this process.  Drug prices often vary depending on where the prescription is filled and some pharmacies may offer cheaper prices.  The website www.goodrx.com contains coupons for medications through different pharmacies. The prices here do not account for what the cost may be with help from insurance (it may be cheaper with your insurance), but the website can give you the price if you did not use any insurance.  - You can print the associated coupon and take it with   your prescription to the pharmacy.  - You may also stop by our office during regular business hours and pick up a GoodRx coupon card.  - If you need your prescription sent electronically to a different pharmacy, notify our office through [facility] MyChart or by phone at [phone] option 4.     Si Usted Necesita Algo Despus de Su Visita  Tambin puede enviarnos un mensaje a travs de MyChart. Por lo general respondemos a los mensajes de MyChart en el transcurso de 1 a 2  das hbiles.  Para renovar recetas, por favor pida a su farmacia que se ponga en contacto con nuestra oficina. Nuestro nmero de fax es el [phone].  Si tiene un asunto urgente cuando la clnica est cerrada y que no puede esperar hasta el siguiente da hbil, puede llamar/localizar a su doctor(a) al nmero que aparece a continuacin.   Por favor, tenga en cuenta que aunque hacemos todo lo posible para estar disponibles para asuntos urgentes fuera del horario de oficina, no estamos disponibles las 24 horas del da, los 7 das de la semana.   Si tiene un problema urgente y no puede comunicarse con nosotros, puede optar por buscar atencin mdica  en el consultorio de su doctor(a), en una clnica privada, en un centro de atencin urgente o en una sala de emergencias.  Si tiene una emergencia mdica, por favor llame inmediatamente al 911 o vaya a la sala de emergencias.  Nmeros de bper  - Dr. Kowalski: [phone]  - Dra. Moye: [phone]  - Dra. Stewart: [phone]  En caso de inclemencias del tiempo, por favor llame a nuestra lnea principal al [phone] para una actualizacin sobre el estado de cualquier retraso o cierre.  Consejos para la medicacin en dermatologa: Por favor, guarde las cajas en las que vienen los medicamentos de uso tpico para ayudarle a seguir las instrucciones sobre dnde y cmo usarlos. Las farmacias generalmente imprimen las instrucciones del medicamento slo en las cajas y no directamente en los tubos del medicamento.   Si su medicamento es muy caro, por favor, pngase en contacto con nuestra oficina llamando al [phone] y presione la opcin 4 o envenos un mensaje a travs de MyChart.   No podemos decirle cul ser su copago por los medicamentos por adelantado ya que esto es diferente dependiendo de la cobertura de su seguro. Sin embargo, es posible que podamos encontrar un medicamento sustituto a menor costo o llenar un formulario para que el  seguro cubra el medicamento que se considera necesario.   Si se requiere una autorizacin previa para que su compaa de seguros cubra su medicamento, por favor permtanos de 1 a 2 das hbiles para completar este proceso.  Los precios de los medicamentos varan con frecuencia dependiendo del lugar de dnde se surte la receta y alguna farmacias pueden ofrecer precios ms baratos.  El sitio web www.goodrx.com tiene cupones para medicamentos de diferentes farmacias. Los precios aqu no tienen en cuenta lo que podra costar con la ayuda del seguro (puede ser ms barato con su seguro), pero el sitio web puede darle el precio si no utiliz ningn seguro.  - Puede imprimir el cupn correspondiente y llevarlo con su receta a la farmacia.  - Tambin puede pasar por nuestra oficina durante el horario de atencin regular y recoger una tarjeta de cupones de GoodRx.  - Si necesita que su receta se enve electrnicamente a una farmacia diferente, informe a nuestra oficina a travs de MyChart de [facility]   o por telfono llamando al [phone] y presione la opcin 4.  

## 2022-03-23 NOTE — Progress Notes (Signed)
   Follow-Up Visit   Subjective  Philip Reynolds is a 51 y.o. male who presents for the following: Bx proven superficial BCC (R post base of neck, patient is here today for [facility]). The patient has spots, moles and lesions to be evaluated, some may be new or changing.  The following portions of the chart were reviewed this encounter and updated as appropriate:   Tobacco  Allergies  Meds  Problems  Med Hx  Surg Hx  Fam Hx     Review of Systems:  No other skin or systemic complaints except as noted in HPI or Assessment and Plan.  Objective  Well appearing patient in no apparent distress; mood and affect are within normal limits.  A focused examination was performed including the face, trunk, extremities. Relevant physical exam findings are noted in the Assessment and Plan.  R post base of neck 2.5 cm pink patch  R post lat neck 1.5 x 1.0 cm non mobile firm papule    Assessment & Plan  Basal cell carcinoma (BCC) of skin of neck R post base of neck  Destruction of lesion Complexity: extensive   Destruction method: electrodesiccation and curettage   Informed consent: discussed and consent obtained   Timeout:  patient name, date of birth, surgical site, and procedure verified Procedure prep:  Patient was prepped and draped in usual sterile fashion Prep type:  Isopropyl alcohol Anesthesia: the lesion was anesthetized in a standard fashion   Anesthetic:  1% lidocaine w/ epinephrine 1-100,000 buffered w/ 8.4% NaHCO3 Curettage performed in three different directions: Yes   Electrodesiccation performed over the curetted area: Yes   Lesion length (cm):  2.5 Lesion width (cm):  2.5 Margin per side (cm):  0.2 Final wound size (cm):  2.9 Hemostasis achieved with:  pressure, aluminum chloride and electrodesiccation Outcome: patient tolerated procedure well with no complications   Post-procedure details: sterile dressing applied and wound care instructions given   Dressing type:  bandage and petrolatum    Discussed treatment options of surgical excision vs ED&C vs topical chemotherapy creams. Recommend ED&C today.   Enlarged lymph node R post lat neck Reviewed CT - patient to follow up with PCP  Actinic Damage - chronic, secondary to cumulative UV radiation exposure/sun exposure over time - diffuse scaly erythematous macules with underlying dyspigmentation - Recommend daily broad spectrum sunscreen SPF 30+ to sun-exposed areas, reapply every 2 hours as needed.  - Recommend staying in the shade or wearing long sleeves, sun glasses (UVA+UVB protection) and wide brim hats (4-inch brim around the entire circumference of the hat). - Call for new or changing lesions.  Return for appointment as scheduled.  Luther Redo, CMA, am acting as scribe for Sarina Ser, MD . Documentation: I have reviewed the above documentation for accuracy and completeness, and I agree with the above.  Sarina Ser, MD

## 2022-03-27 ENCOUNTER — Encounter: Payer: Self-pay | Admitting: Dermatology

## 2022-05-18 ENCOUNTER — Encounter: Payer: Self-pay | Admitting: Family Medicine

## 2022-05-18 ENCOUNTER — Encounter: Payer: BC Managed Care – PPO | Admitting: Family Medicine

## 2022-05-18 ENCOUNTER — Other Ambulatory Visit: Payer: Self-pay | Admitting: Family Medicine

## 2022-05-18 ENCOUNTER — Ambulatory Visit (INDEPENDENT_AMBULATORY_CARE_PROVIDER_SITE_OTHER): Payer: BC Managed Care – PPO | Admitting: Family Medicine

## 2022-05-18 VITALS — BP 110/70 | HR 63 | Temp 97.6°F | Ht 72.0 in | Wt 205.0 lb

## 2022-05-18 DIAGNOSIS — R229 Localized swelling, mass and lump, unspecified: Secondary | ICD-10-CM | POA: Diagnosis not present

## 2022-05-18 DIAGNOSIS — Z0000 Encounter for general adult medical examination without abnormal findings: Secondary | ICD-10-CM | POA: Diagnosis not present

## 2022-05-18 DIAGNOSIS — N529 Male erectile dysfunction, unspecified: Secondary | ICD-10-CM

## 2022-05-18 DIAGNOSIS — Z13220 Encounter for screening for lipoid disorders: Secondary | ICD-10-CM | POA: Diagnosis not present

## 2022-05-18 DIAGNOSIS — R21 Rash and other nonspecific skin eruption: Secondary | ICD-10-CM | POA: Insufficient documentation

## 2022-05-18 LAB — HEPATIC FUNCTION PANEL
ALT: 24 U/L (ref 0–53)
AST: 18 U/L (ref 0–37)
Albumin: 4.3 g/dL (ref 3.5–5.2)
Alkaline Phosphatase: 61 U/L (ref 39–117)
Bilirubin, Direct: 0.1 mg/dL (ref 0.0–0.3)
Total Bilirubin: 0.7 mg/dL (ref 0.2–1.2)
Total Protein: 7.3 g/dL (ref 6.0–8.3)

## 2022-05-18 LAB — LIPID PANEL
Cholesterol: 216 mg/dL — ABNORMAL HIGH (ref 0–200)
HDL: 33.8 mg/dL — ABNORMAL LOW (ref >39.00)
LDL Cholesterol: 146 mg/dL — ABNORMAL HIGH (ref 0–99)
NonHDL: 182.58
Total CHOL/HDL Ratio: 6
Triglycerides: 184 mg/dL — ABNORMAL HIGH (ref 0.0–149.0)
VLDL: 36.8 mg/dL (ref 0.0–40.0)

## 2022-05-18 MED ORDER — SILDENAFIL CITRATE 50 MG PO TABS: 50.0000 mg | ORAL_TABLET | Freq: Every day | ORAL | 0 refills | Status: DC | PRN

## 2022-05-18 NOTE — Patient Instructions (Signed)
Nice to see you. We will call you with your lab results.

## 2022-05-18 NOTE — Assessment & Plan Note (Addendum)
CT did not show any significant findings in the area of concern.  There was a subcentimeter lymph node in this area that likely represents the area of concern.  Discussed there are 2 options at this time.  These include monitoring and if there are any changes or symptoms related to this area we can pursue further evaluation or we can send him to a surgeon to have them review the scan and his exam to determine if this area needs to be biopsied.  Patient opted to monitor and I feel as though this is reasonable given the benign findings on his CT scan.  If he has any changes he will let us know.

## 2022-05-18 NOTE — Assessment & Plan Note (Signed)
Physical exam completed.  Encouraged continued healthy diet and exercise.  Colonoscopy is up-to-date.  He defers flu vaccine to later in the season.  Discussed getting this at least by the end of October.  He declines COVID vaccination.  We will get lab work today.

## 2022-05-18 NOTE — Assessment & Plan Note (Addendum)
He requests a refill of his Viagra.  He notes this was beneficial.

## 2022-05-18 NOTE — Assessment & Plan Note (Signed)
Less likely to be related to shingles given that it crossed midline.  Discussed returning if he has any recurrence so that I can visualize the rash.

## 2022-05-18 NOTE — Progress Notes (Signed)
Philip Rumps, MD Phone: [phone]  Philip Reynolds is a 51 y.o. male who presents today for CPE.  Diet: healthy, not much soda or sweets Exercise: 2x/week with cardio and weights Colonoscopy: 12/22/20 3 year recall Prostate cancer screening: not indicated Family history-  Prostate cancer: no  Colon cancer: no Vaccines-   Flu: defers to later in the season  Tetanus: UTD  COVID19: declines HIV screening: done previously Hep C Screening: done previously Tobacco use: no Alcohol use: 2 beverages 7-8E/UMPN Illicit Drug use: no Dentist: yes Ophthalmology: yes Notes of rash that occurred over his shoulders after having skin cancer treated on his back.  He wondered if this was shingles.  It has resolved at this time.  He does note it crossed the midline on his back.   Active Ambulatory Problems    Diagnosis Date Noted   Basal cell carcinoma 05/28/2009   Hyperlipidemia 09/30/2008   Lipoma of skin and subcutaneous tissue 05/18/2011   Encounter for general adult medical examination without abnormal findings 07/18/2013   Herpesviral infection 10/10/2014   Nevus 03/09/2017   Subcutaneous nodule 03/20/2019   Vision changes 03/20/2019   Erectile dysfunction 10/30/2019   Zoster without complications 36/14/4315   Polyp of transverse colon    Cecal polyp    Chronic neck pain 03/18/2021   Left shoulder pain 05/18/2021   Overweight 05/18/2021   Skin lesions 05/18/2021   Rash 05/18/2022   Resolved Ambulatory Problems    Diagnosis Date Noted   UMBILICAL HERNIA 40/03/6760   Right inguinal hernia 95/04/3266   Umbilical hernia 12/45/8099   Atypical chest pain 03/20/2019   Abnormal breathing 10/30/2019   Left hand pain 05/18/2020   Benign lipomatous neoplasm of skin and subcutaneous tissue 05/18/2011   Encounter for screening colonoscopy    Past Medical History:  Diagnosis Date   Basal cell carcinoma of back    Herpes infection 10/10/2014   Herpetic whitlow    Other and  unspecified hyperlipidemia     Family History  Problem Relation Age of Onset   Diabetes Father    Stroke Father        Post radiation for brain tumor   Cancer Father        Brain tumor   Depression Father    Kidney disease Neg Hx    Alcohol abuse Neg Hx    Drug abuse Neg Hx    CAD Neg Hx     Social History   Socioeconomic History   Marital status: Divorced    Spouse name: Not on file   Number of children: 0   Years of education: Not on file   Highest education level: Not on file  Occupational History   Occupation: Self owned Dealer business    Comment: 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   Activity: regular gym 5d /wk   Diet: tries to eat healthy, avoids fast food, good water, fruits/vegetables daily   Social Determinants of Health   Financial Resource Strain: Not on file  Food Insecurity: Not on file  Transportation Needs: Not on file  Physical Activity: Not on file  Stress: Not on file  Social Connections: Not on file  Intimate Partner Violence: Not on file    ROS  General:  Negative for nexplained weight loss, fever Skin: Negative for new or changing mole, sore that won't heal HEENT: Negative for trouble hearing, trouble seeing, ringing in ears, mouth sores, hoarseness, change in voice, dysphagia. CV:  Negative for chest pain, dyspnea, edema, palpitations Resp: Negative for cough, dyspnea, hemoptysis GI: Negative for nausea, vomiting, diarrhea, constipation, abdominal pain, melena, hematochezia. GU: Negative for dysuria, incontinence, urinary hesitance, hematuria, vaginal or penile discharge, polyuria, sexual difficulty, lumps in testicle or breasts MSK: Negative  for muscle cramps or aches, joint pain or swelling Neuro: Negative for headaches, weakness, numbness, dizziness, passing out/fainting Psych: Negative for depression, anxiety, memory problems  Objective  Physical Exam Vitals:   05/18/22 0833  BP: 110/70  Pulse: 63  Temp: 97.6 F (36.4 C)  SpO2: 98%    BP Readings from Last 3 Encounters:  05/18/22 110/70  02/21/22 120/78  05/18/21 118/80   Wt Readings from Last 3 Encounters:  05/18/22 205 lb (93 kg)  02/21/22 201 lb 9.6 oz (91.4 kg)  05/18/21 203 lb 6.4 oz (92.3 kg)    Physical Exam Constitutional:      General: He is not in acute distress.    Appearance: He is not diaphoretic.  Neck:   Cardiovascular:     Rate and Rhythm: Normal rate and regular rhythm.     Heart sounds: Normal heart sounds.  Pulmonary:     Effort: Pulmonary effort is normal.     Breath sounds: Normal breath sounds.  Abdominal:     General: Bowel sounds are normal. There is no distension.     Palpations: Abdomen is soft.     Tenderness: There is no abdominal tenderness.  Musculoskeletal:     Right lower leg: No edema.     Left lower leg: No edema.  Lymphadenopathy:     Cervical: No cervical adenopathy.  Skin:    General: Skin is warm and dry.  Neurological:     Mental Status: He is alert.  Psychiatric:        Mood and Affect: Mood normal.      Assessment/Plan:   Problem List Items Addressed This Visit     Encounter for general adult medical examination without abnormal findings - Primary    Physical exam completed.  Encouraged continued healthy diet and exercise.  Colonoscopy is up-to-date.  He defers flu vaccine to later in the season.  Discussed getting this at least by the end of October.  He declines COVID vaccination.  We will get lab work today.      Erectile dysfunction    He requests a refill of his Viagra.  He notes this was beneficial.      Relevant Medications   sildenafil (VIAGRA) 50 MG tablet   Rash    Less likely  to be related to shingles given that it crossed midline.  Discussed returning if he has any recurrence so that I can visualize the rash.      Subcutaneous nodule    CT did not show any significant findings in the area of concern.  There was a subcentimeter lymph node in this area that likely represents the area of concern.  Discussed there are 2 options at this time.  These include monitoring and if there are any changes or symptoms related to this area we can pursue further evaluation or we can send him to a surgeon to have  them review the scan and his exam to determine if this area needs to be biopsied.  Patient opted to monitor and I feel as though this is reasonable given the benign findings on his CT scan.  If he has any changes he will let us know.      Other Visit Diagnoses     Lipid screening       Relevant Orders   Lipid panel   Hepatic function panel       Return in about 1 year (around 05/19/2023) for CPE.   Philip Rumps, MD [place]

## 2022-06-24 DIAGNOSIS — F4321 Adjustment disorder with depressed mood: Secondary | ICD-10-CM | POA: Diagnosis not present

## 2022-06-30 DIAGNOSIS — F4321 Adjustment disorder with depressed mood: Secondary | ICD-10-CM | POA: Diagnosis not present

## 2022-08-05 DIAGNOSIS — F4321 Adjustment disorder with depressed mood: Secondary | ICD-10-CM | POA: Diagnosis not present

## 2022-09-06 ENCOUNTER — Other Ambulatory Visit: Payer: Self-pay | Admitting: Family Medicine

## 2022-09-06 DIAGNOSIS — N529 Male erectile dysfunction, unspecified: Secondary | ICD-10-CM

## 2022-09-07 MED ORDER — SILDENAFIL CITRATE 50 MG PO TABS: 50.0000 mg | ORAL_TABLET | Freq: Every day | ORAL | 0 refills | Status: DC | PRN

## 2022-09-28 ENCOUNTER — Ambulatory Visit: Payer: BC Managed Care – PPO | Admitting: Dermatology

## 2022-09-28 DIAGNOSIS — L821 Other seborrheic keratosis: Secondary | ICD-10-CM

## 2022-09-28 DIAGNOSIS — L570 Actinic keratosis: Secondary | ICD-10-CM

## 2022-09-28 DIAGNOSIS — D229 Melanocytic nevi, unspecified: Secondary | ICD-10-CM

## 2022-09-28 DIAGNOSIS — L820 Inflamed seborrheic keratosis: Secondary | ICD-10-CM | POA: Diagnosis not present

## 2022-09-28 DIAGNOSIS — L918 Other hypertrophic disorders of the skin: Secondary | ICD-10-CM

## 2022-09-28 DIAGNOSIS — L578 Other skin changes due to chronic exposure to nonionizing radiation: Secondary | ICD-10-CM | POA: Diagnosis not present

## 2022-09-28 DIAGNOSIS — L814 Other melanin hyperpigmentation: Secondary | ICD-10-CM

## 2022-09-28 DIAGNOSIS — Z85828 Personal history of other malignant neoplasm of skin: Secondary | ICD-10-CM | POA: Diagnosis not present

## 2022-09-28 NOTE — Progress Notes (Signed)
Follow-Up Visit   Subjective  Philip Reynolds is a 52 y.o. male who presents for the following: Irregular skin lesions (On the medial canthus and scalp - patient is concerned and would like lesion checked and treated today). The patient has spots, moles and lesions to be evaluated, some may be new or changing and the patient has concerns that these could be cancer.  The following portions of the chart were reviewed this encounter and updated as appropriate:   Tobacco  Allergies  Meds  Problems  Med Hx  Surg Hx  Fam Hx     Review of Systems:  No other skin or systemic complaints except as noted in HPI or Assessment and Plan.  Objective  Well appearing patient in no apparent distress; mood and affect are within normal limits.  A focused examination was performed including the face, neck, and scalp. Relevant physical exam findings are noted in the Assessment and Plan.  Scalp x 6, nose x 1 (7) Erythematous thin papules/macules with gritty scale.   L med canthus x 1 Erythematous stuck-on, waxy papule or plaque  L thigh x 2 Fleshy, skin-colored pedunculated papules.     Assessment & Plan  AK (actinic keratosis) (7) Scalp x 6, nose x 1 Destruction of lesion - Scalp x 6, nose x 1 Complexity: simple   Destruction method: cryotherapy   Informed consent: discussed and consent obtained   Timeout:  patient name, date of birth, surgical site, and procedure verified Lesion destroyed using liquid nitrogen: Yes   Region frozen until ice ball extended beyond lesion: Yes   Outcome: patient tolerated procedure well with no complications   Post-procedure details: wound care instructions given    Inflamed seborrheic keratosis L med canthus; Left lower eyelid margin x 1 Symptomatic, irritating, patient would like treated. Destruction of lesion - L med canthus x 1 Complexity: simple   Destruction method: cryotherapy   Informed consent: discussed and consent obtained   Timeout:   patient name, date of birth, surgical site, and procedure verified Lesion destroyed using liquid nitrogen: Yes   Region frozen until ice ball extended beyond lesion: Yes   Outcome: patient tolerated procedure well with no complications   Post-procedure details: wound care instructions given    Skin tag L thigh x 2 Acrochordons (Skin Tags) - Removal desired by patient - Fleshy, skin-colored pedunculated papules - Benign appearing.  - Patient desires removal. Reviewed that this is not covered by insurance and they will be charged a cosmetic fee for removal. Patient signed non-covered consent.  - Prior to the procedure, reviewed the expected small wound. Also reviewed the risk of leaving a small scar and the small risk of infection.  PROCEDURE - The areas were prepped with isopropyl alcohol. A small amount of lidocaine 1% with epinephrine was injected at the base of each lesion to achieve good local anesthesia. The skin tags were removed using a snip technique. Aluminum chloride was used for hemostasis. Petrolatum and a bandage were applied. The procedure was tolerated well. - Wound care was reviewed with the patient. They were advised to call with any concerns. Total number of treated acrochordons 2   Actinic Damage - chronic, secondary to cumulative UV radiation exposure/sun exposure over time - diffuse scaly erythematous macules with underlying dyspigmentation - Recommend daily broad spectrum sunscreen SPF 30+ to sun-exposed areas, reapply every 2 hours as needed.  - Recommend staying in the shade or wearing long sleeves, sun glasses (UVA+UVB protection) and wide  brim hats (4-inch brim around the entire circumference of the hat). - Call for new or changing lesions.  Seborrheic Keratoses - Stuck-on, waxy, tan-brown papules and/or plaques  - Benign-appearing - Discussed benign etiology and prognosis. - Observe - Call for any changes  Lentigines - Scattered tan macules - Due to sun  exposure - Benign-appearing, observe - Recommend daily broad spectrum sunscreen SPF 30+ to sun-exposed areas, reapply every 2 hours as needed. - Call for any changes  Melanocytic Nevi - Tan-brown and/or pink-flesh-colored symmetric macules and papules - Benign appearing on exam today - Observation - Call clinic for new or changing moles - Recommend daily use of broad spectrum spf 30+ sunscreen to sun-exposed areas.   History of Basal Cell Carcinoma of the Skin - No evidence of recurrence today - Recommend regular full body skin exams - Recommend daily broad spectrum sunscreen SPF 30+ to sun-exposed areas, reapply every 2 hours as needed.  - Call if any new or changing lesions are noted between office visits  Acrochordons (Skin Tags) - Fleshy, skin-colored pedunculated papules - Benign appearing.  - Observe. - If desired, they can be removed with an in office procedure that is not covered by insurance. - Please call the clinic if you notice any new or changing lesions.  Return in about 5 months (around 02/26/2023) for TBSE.  Luther Redo, CMA, am acting as scribe for Sarina Ser, MD . Documentation: I have reviewed the above documentation for accuracy and completeness, and I agree with the above.  Sarina Ser, MD

## 2022-09-28 NOTE — Patient Instructions (Signed)
Due to recent changes in healthcare laws, you may see results of your pathology and/or laboratory studies on MyChart before the doctors have had a chance to review them. We understand that in some cases there may be results that are confusing or concerning to you. Please understand that not all results are received at the same time and often the doctors may need to interpret multiple results in order to provide you with the best plan of care or course of treatment. Therefore, we ask that you please give us 2 business days to thoroughly review all your results before contacting the office for clarification. Should we see a critical lab result, you will be contacted sooner.   If You Need Anything After Your Visit  If you have any questions or concerns for your doctor, please call our main line at [phone] and press option 4 to reach your doctor's medical assistant. If no one answers, please leave a voicemail as directed and we will return your call as soon as possible. Messages left after 4 pm will be answered the following business day.   You may also send us a message via MyChart. We typically respond to MyChart messages within 1-2 business days.  For prescription refills, please ask your pharmacy to contact our office. Our fax number is [phone].  If you have an urgent issue when the clinic is closed that cannot wait until the next business day, you can page your doctor at the number below.    Please note that while we do our best to be available for urgent issues outside of office hours, we are not available 24/7.   If you have an urgent issue and are unable to reach us, you may choose to seek medical care at your doctor's office, retail clinic, urgent care center, or emergency room.  If you have a medical emergency, please immediately call 911 or go to the emergency department.  Pager Numbers  - Dr. Kowalski: [phone]  - Dr. Moye: [phone]  - Dr. Stewart:  [phone]  In the event of inclement weather, please call our main line at [phone] for an update on the status of any delays or closures.  Dermatology Medication Tips: Please keep the boxes that topical medications come in in order to help keep track of the instructions about where and how to use these. Pharmacies typically print the medication instructions only on the boxes and not directly on the medication tubes.   If your medication is too expensive, please contact our office at [phone] option 4 or send us a message through MyChart.   We are unable to tell what your co-pay for medications will be in advance as this is different depending on your insurance coverage. However, we may be able to find a substitute medication at lower cost or fill out paperwork to get insurance to cover a needed medication.   If a prior authorization is required to get your medication covered by your insurance company, please allow us 1-2 business days to complete this process.  Drug prices often vary depending on where the prescription is filled and some pharmacies may offer cheaper prices.  The website www.goodrx.com contains coupons for medications through different pharmacies. The prices here do not account for what the cost may be with help from insurance (it may be cheaper with your insurance), but the website can give you the price if you did not use any insurance.  - You can print the associated coupon and take it with   your prescription to the pharmacy.  - You may also stop by our office during regular business hours and pick up a GoodRx coupon card.  - If you need your prescription sent electronically to a different pharmacy, notify our office through [facility] MyChart or by phone at [phone] option 4.     Si Usted Necesita Algo Despus de Su Visita  Tambin puede enviarnos un mensaje a travs de MyChart. Por lo general respondemos a los mensajes de MyChart en el transcurso de 1 a 2  das hbiles.  Para renovar recetas, por favor pida a su farmacia que se ponga en contacto con nuestra oficina. Nuestro nmero de fax es el [phone].  Si tiene un asunto urgente cuando la clnica est cerrada y que no puede esperar hasta el siguiente da hbil, puede llamar/localizar a su doctor(a) al nmero que aparece a continuacin.   Por favor, tenga en cuenta que aunque hacemos todo lo posible para estar disponibles para asuntos urgentes fuera del horario de oficina, no estamos disponibles las 24 horas del da, los 7 das de la semana.   Si tiene un problema urgente y no puede comunicarse con nosotros, puede optar por buscar atencin mdica  en el consultorio de su doctor(a), en una clnica privada, en un centro de atencin urgente o en una sala de emergencias.  Si tiene una emergencia mdica, por favor llame inmediatamente al 911 o vaya a la sala de emergencias.  Nmeros de bper  - Dr. Kowalski: [phone]  - Dra. Moye: [phone]  - Dra. Stewart: [phone]  En caso de inclemencias del tiempo, por favor llame a nuestra lnea principal al [phone] para una actualizacin sobre el estado de cualquier retraso o cierre.  Consejos para la medicacin en dermatologa: Por favor, guarde las cajas en las que vienen los medicamentos de uso tpico para ayudarle a seguir las instrucciones sobre dnde y cmo usarlos. Las farmacias generalmente imprimen las instrucciones del medicamento slo en las cajas y no directamente en los tubos del medicamento.   Si su medicamento es muy caro, por favor, pngase en contacto con nuestra oficina llamando al [phone] y presione la opcin 4 o envenos un mensaje a travs de MyChart.   No podemos decirle cul ser su copago por los medicamentos por adelantado ya que esto es diferente dependiendo de la cobertura de su seguro. Sin embargo, es posible que podamos encontrar un medicamento sustituto a menor costo o llenar un formulario para que el  seguro cubra el medicamento que se considera necesario.   Si se requiere una autorizacin previa para que su compaa de seguros cubra su medicamento, por favor permtanos de 1 a 2 das hbiles para completar este proceso.  Los precios de los medicamentos varan con frecuencia dependiendo del lugar de dnde se surte la receta y alguna farmacias pueden ofrecer precios ms baratos.  El sitio web www.goodrx.com tiene cupones para medicamentos de diferentes farmacias. Los precios aqu no tienen en cuenta lo que podra costar con la ayuda del seguro (puede ser ms barato con su seguro), pero el sitio web puede darle el precio si no utiliz ningn seguro.  - Puede imprimir el cupn correspondiente y llevarlo con su receta a la farmacia.  - Tambin puede pasar por nuestra oficina durante el horario de atencin regular y recoger una tarjeta de cupones de GoodRx.  - Si necesita que su receta se enve electrnicamente a una farmacia diferente, informe a nuestra oficina a travs de MyChart de [facility]   o por telfono llamando al [phone] y presione la opcin 4.  

## 2022-09-30 ENCOUNTER — Ambulatory Visit (INDEPENDENT_AMBULATORY_CARE_PROVIDER_SITE_OTHER): Payer: BC Managed Care – PPO

## 2022-09-30 ENCOUNTER — Encounter: Payer: Self-pay | Admitting: Family Medicine

## 2022-09-30 ENCOUNTER — Ambulatory Visit: Payer: BC Managed Care – PPO | Admitting: Family Medicine

## 2022-09-30 VITALS — BP 110/80 | HR 78 | Temp 97.8°F | Ht 72.0 in | Wt 208.6 lb

## 2022-09-30 DIAGNOSIS — R200 Anesthesia of skin: Secondary | ICD-10-CM | POA: Diagnosis not present

## 2022-09-30 DIAGNOSIS — M542 Cervicalgia: Secondary | ICD-10-CM | POA: Diagnosis not present

## 2022-09-30 DIAGNOSIS — M5412 Radiculopathy, cervical region: Secondary | ICD-10-CM

## 2022-09-30 MED ORDER — PREDNISONE 20 MG PO TABS: 40.0000 mg | ORAL_TABLET | Freq: Every day | ORAL | 0 refills | Status: DC

## 2022-09-30 NOTE — Progress Notes (Signed)
  [name], MD Phone: [phone]  MUNEER LEIDER is a 52 y.o. male who presents today for same-day visit.   Left neck and shoulder pain: patient reports that his pain started at his shoulder blade about a month ago. A week after the pain began it spread to his left neck and down the back of his  left arm and forearm. The pain is constant. Patient states that his pain worsens with rest and is alleviated with movement. He denies any new numbness in his fingers. He does report some numbness in his left forearm. He has tried ibuprofen '1600mg'$  per day with no relief.   Social History   Tobacco Use  Smoking Status Never  Smokeless Tobacco Never    Current Outpatient Medications on File Prior to Visit  Medication Sig Dispense Refill   sildenafil (VIAGRA) 50 MG tablet Take 1 tablet (50 mg total) by mouth daily as needed for erectile dysfunction. (Patient not taking: Reported on 09/28/2022) 90 tablet 0   No current facility-administered medications on file prior to visit.     ROS see history of present illness  Objective  Physical Exam Vitals:   09/30/22 0838  BP: 110/80  Pulse: 78  Temp: 97.8 F (36.6 C)  SpO2: 98%    BP Readings from Last 3 Encounters:  09/30/22 110/80  05/18/22 110/70  02/21/22 120/78   Wt Readings from Last 3 Encounters:  09/30/22 208 lb 9.6 oz (94.6 kg)  05/18/22 205 lb (93 kg)  02/21/22 201 lb 9.6 oz (91.4 kg)    Physical Exam Constitutional:      Appearance: Normal appearance.  HENT:     Head: Normocephalic and atraumatic.  Cardiovascular:     Rate and Rhythm: Normal rate and regular rhythm.  Pulmonary:     Effort: Pulmonary effort is normal.     Breath sounds: Normal breath sounds.  Musculoskeletal:     Right shoulder: Normal.     Left shoulder: Tenderness present.     Comments: No pain upon passive range of motion of his left shoulder. Strength 5/5 in bilateral upper extremities and lower extremities. Sensation to light touch  intact throughout all extremities. 2+ patellar, biceps, and brachioradialis reflexes.   Skin:    General: Skin is warm and dry.  Neurological:     Mental Status: He is alert.      Assessment/Plan: Please see individual problem list.  Problem List Items Addressed This Visit     Cervical radiculopathy - Primary    Likely a pinched nerve that is affected his left shoulder and arm. He is neurologically intact in upper and lower extremities. We will get a xray of his cervical spine today and refer to physical therapy to see if this will help with the pain. We will also trial prednisone '40mg'$  daily to treat any underlying inflammation. Counseled patient on possible side effects such as agitation, difficulty sleeping, and increase appetite. He will let us know if the pain worsens. Advised to seek medical attention for worsening pain, numbness, or weakness.      Relevant Medications   predniSONE (DELTASONE) 20 MG tablet   Other Relevant Orders   DG Cervical Spine Complete   Ambulatory referral to Physical Therapy     Return if symptoms worsen or fail to improve.   Marisa Cyphers, Medical Student [place]

## 2022-09-30 NOTE — Patient Instructions (Addendum)
Nice to see you. We will get a xray of your neck today and contact you about the results. We will refer you to physical therapy for your neck. Take 2 tablets of prednisone at breakfast.

## 2022-09-30 NOTE — Assessment & Plan Note (Addendum)
Likely a pinched nerve that is affected his left shoulder and arm. He is neurologically intact in upper and lower extremities. We will get a xray of his cervical spine today and refer to physical therapy to see if this will help with the pain. We will also trial prednisone '40mg'$  daily to treat any underlying inflammation. Counseled patient on possible side effects such as agitation, difficulty sleeping, and increase appetite. He will let us know if the pain worsens. Advised to seek medical attention for worsening pain, numbness, or weakness.

## 2022-09-30 NOTE — Progress Notes (Signed)
Patient seen along with medical student Zada Zhong.  I personally evaluated this patient along with the student, and verified all aspects of the history, physical exam, and medical decision making as documented by the student.  I agree with the student's documentation and have made all necessary edits.  Julies Carmickle, MD  

## 2022-10-01 ENCOUNTER — Encounter: Payer: Self-pay | Admitting: Dermatology

## 2022-10-05 ENCOUNTER — Encounter: Payer: Self-pay | Admitting: Family Medicine

## 2022-10-05 ENCOUNTER — Ambulatory Visit: Payer: BC Managed Care – PPO | Admitting: Family Medicine

## 2022-10-05 MED ORDER — TRAMADOL HCL 50 MG PO TABS: 50.0000 mg | ORAL_TABLET | Freq: Three times a day (TID) | ORAL | 0 refills | Status: DC | PRN

## 2022-10-17 ENCOUNTER — Encounter: Payer: Self-pay | Admitting: Family Medicine

## 2022-10-17 ENCOUNTER — Other Ambulatory Visit: Payer: Self-pay | Admitting: Family Medicine

## 2022-10-17 DIAGNOSIS — M5412 Radiculopathy, cervical region: Secondary | ICD-10-CM

## 2022-10-17 MED ORDER — TRAMADOL HCL 50 MG PO TABS: 50.0000 mg | ORAL_TABLET | Freq: Three times a day (TID) | ORAL | 0 refills | Status: DC | PRN

## 2022-10-19 ENCOUNTER — Ambulatory Visit: Payer: BC Managed Care – PPO | Admitting: Physical Therapy

## 2022-10-20 DIAGNOSIS — M542 Cervicalgia: Secondary | ICD-10-CM | POA: Diagnosis not present

## 2022-10-20 DIAGNOSIS — M5412 Radiculopathy, cervical region: Secondary | ICD-10-CM | POA: Diagnosis not present

## 2022-10-25 ENCOUNTER — Encounter: Payer: BC Managed Care – PPO | Admitting: Physical Therapy

## 2022-10-26 DIAGNOSIS — M542 Cervicalgia: Secondary | ICD-10-CM | POA: Diagnosis not present

## 2022-10-26 DIAGNOSIS — M5412 Radiculopathy, cervical region: Secondary | ICD-10-CM | POA: Diagnosis not present

## 2022-10-27 ENCOUNTER — Encounter: Payer: BC Managed Care – PPO | Admitting: Physical Therapy

## 2022-10-28 ENCOUNTER — Other Ambulatory Visit: Payer: Self-pay | Admitting: Family Medicine

## 2022-10-28 MED ORDER — TRAMADOL HCL 50 MG PO TABS: 50.0000 mg | ORAL_TABLET | Freq: Three times a day (TID) | ORAL | 0 refills | Status: DC | PRN

## 2022-10-31 ENCOUNTER — Encounter: Payer: BC Managed Care – PPO | Admitting: Physical Therapy

## 2022-11-02 ENCOUNTER — Encounter: Payer: BC Managed Care – PPO | Admitting: Physical Therapy

## 2022-11-08 DIAGNOSIS — M542 Cervicalgia: Secondary | ICD-10-CM | POA: Diagnosis not present

## 2022-11-08 DIAGNOSIS — M5412 Radiculopathy, cervical region: Secondary | ICD-10-CM | POA: Diagnosis not present

## 2022-11-18 ENCOUNTER — Encounter: Payer: Self-pay | Admitting: Family Medicine

## 2022-11-18 DIAGNOSIS — M5412 Radiculopathy, cervical region: Secondary | ICD-10-CM

## 2022-12-02 ENCOUNTER — Telehealth: Payer: Self-pay | Admitting: Family Medicine

## 2022-12-02 NOTE — Telephone Encounter (Signed)
Lft pt vm to call ofc to sch MRI. thanks 

## 2022-12-14 ENCOUNTER — Ambulatory Visit
Admission: RE | Admit: 2022-12-14 | Discharge: 2022-12-14 | Disposition: A | Discharge status: Home / Self Care | Payer: BC Managed Care – PPO | Source: Ambulatory Visit | Attending: Family Medicine | Admitting: Family Medicine

## 2022-12-14 ENCOUNTER — Ambulatory Visit: Payer: BC Managed Care – PPO

## 2022-12-14 DIAGNOSIS — M4802 Spinal stenosis, cervical region: Secondary | ICD-10-CM | POA: Diagnosis not present

## 2022-12-14 DIAGNOSIS — M5412 Radiculopathy, cervical region: Secondary | ICD-10-CM

## 2022-12-19 ENCOUNTER — Other Ambulatory Visit: Payer: Self-pay | Admitting: Family Medicine

## 2022-12-19 DIAGNOSIS — M5412 Radiculopathy, cervical region: Secondary | ICD-10-CM

## 2022-12-19 MED ORDER — GABAPENTIN 300 MG PO CAPS: 300.0000 mg | ORAL_CAPSULE | Freq: Every day | ORAL | 3 refills | Status: DC

## 2022-12-20 ENCOUNTER — Other Ambulatory Visit: Payer: Self-pay | Admitting: Family Medicine

## 2022-12-20 DIAGNOSIS — M5030 Other cervical disc degeneration, unspecified cervical region: Secondary | ICD-10-CM

## 2022-12-20 DIAGNOSIS — M47812 Spondylosis without myelopathy or radiculopathy, cervical region: Secondary | ICD-10-CM

## 2022-12-20 DIAGNOSIS — M5412 Radiculopathy, cervical region: Secondary | ICD-10-CM

## 2022-12-20 NOTE — Telephone Encounter (Signed)
Already spoke to Patient a couple of times

## 2022-12-29 DIAGNOSIS — M542 Cervicalgia: Secondary | ICD-10-CM | POA: Diagnosis not present

## 2023-01-19 ENCOUNTER — Encounter: Payer: Self-pay | Admitting: Dermatology

## 2023-01-19 ENCOUNTER — Ambulatory Visit: Payer: BC Managed Care – PPO | Admitting: Dermatology

## 2023-01-19 VITALS — BP 127/89

## 2023-01-19 DIAGNOSIS — L578 Other skin changes due to chronic exposure to nonionizing radiation: Secondary | ICD-10-CM | POA: Diagnosis not present

## 2023-01-19 DIAGNOSIS — W908XXA Exposure to other nonionizing radiation, initial encounter: Secondary | ICD-10-CM

## 2023-01-19 DIAGNOSIS — L570 Actinic keratosis: Secondary | ICD-10-CM | POA: Diagnosis not present

## 2023-01-19 DIAGNOSIS — Z872 Personal history of diseases of the skin and subcutaneous tissue: Secondary | ICD-10-CM

## 2023-01-19 DIAGNOSIS — L820 Inflamed seborrheic keratosis: Secondary | ICD-10-CM | POA: Diagnosis not present

## 2023-01-19 DIAGNOSIS — X32XXXA Exposure to sunlight, initial encounter: Secondary | ICD-10-CM

## 2023-01-19 DIAGNOSIS — D1801 Hemangioma of skin and subcutaneous tissue: Secondary | ICD-10-CM

## 2023-01-19 DIAGNOSIS — D229 Melanocytic nevi, unspecified: Secondary | ICD-10-CM

## 2023-01-19 DIAGNOSIS — Z1283 Encounter for screening for malignant neoplasm of skin: Secondary | ICD-10-CM

## 2023-01-19 DIAGNOSIS — Z85828 Personal history of other malignant neoplasm of skin: Secondary | ICD-10-CM

## 2023-01-19 DIAGNOSIS — L814 Other melanin hyperpigmentation: Secondary | ICD-10-CM

## 2023-01-19 DIAGNOSIS — L821 Other seborrheic keratosis: Secondary | ICD-10-CM

## 2023-01-19 NOTE — Progress Notes (Signed)
Follow-Up Visit   Subjective  Philip Reynolds is a 52 y.o. male who presents for the following: Skin Cancer Screening and Full Body Skin Exam, hx of BCCs, Aks, check scaly spots scalp, R ear  The patient presents for Total-Body Skin Exam (TBSE) for skin cancer screening and mole check. The patient has spots, moles and lesions to be evaluated, some may be new or changing and the patient has concerns that these could be cancer.    The following portions of the chart were reviewed this encounter and updated as appropriate: medications, allergies, medical history  Review of Systems:  No other skin or systemic complaints except as noted in HPI or Assessment and Plan.  Objective  Well appearing patient in no apparent distress; mood and affect are within normal limits.  A full examination was performed including scalp, head, eyes, ears, nose, lips, neck, chest, axillae, abdomen, back, buttocks, bilateral upper extremities, bilateral lower extremities, hands, feet, fingers, toes, fingernails, and toenails. All findings within normal limits unless otherwise noted below.   Relevant physical exam findings are noted in the Assessment and Plan.  scalp x 7 (7) Pink scaly macules  Right Ear x 1, scalp x 1 (2) Stuck on waxy paps with erythema    Assessment & Plan   LENTIGINES, SEBORRHEIC KERATOSES, HEMANGIOMAS - Benign normal skin lesions - Benign-appearing - Call for any changes  MELANOCYTIC NEVI - Tan-brown and/or pink-flesh-colored symmetric macules and papules - Benign appearing on exam today - Observation - Call clinic for new or changing moles - Recommend daily use of broad spectrum spf 30+ sunscreen to sun-exposed areas.   ACTINIC DAMAGE - Chronic condition, secondary to cumulative UV/sun exposure - diffuse scaly erythematous macules with underlying dyspigmentation - Recommend daily broad spectrum sunscreen SPF 30+ to sun-exposed areas, reapply every 2 hours as needed.  -  Staying in the shade or wearing long sleeves, sun glasses (UVA+UVB protection) and wide brim hats (4-inch brim around the entire circumference of the hat) are also recommended for sun protection.  - Call for new or changing lesions.  SKIN CANCER SCREENING PERFORMED TODAY.  HISTORY OF BASAL CELL CARCINOMA OF THE SKIN - No evidence of recurrence today - Recommend regular full body skin exams - Recommend daily broad spectrum sunscreen SPF 30+ to sun-exposed areas, reapply every 2 hours as needed.  - Call if any new or changing lesions are noted between office visits  - R post base of neck, back  AK (actinic keratosis) (7) scalp x 7  Destruction of lesion - scalp x 7 Complexity: simple   Destruction method: cryotherapy   Informed consent: discussed and consent obtained   Timeout:  patient name, date of birth, surgical site, and procedure verified Lesion destroyed using liquid nitrogen: Yes   Region frozen until ice ball extended beyond lesion: Yes   Outcome: patient tolerated procedure well with no complications   Post-procedure details: wound care instructions given    Inflamed seborrheic keratosis (2) Right Ear x 1, scalp x 1  Symptomatic, irritating, patient would like treated.   Destruction of lesion - Right Ear x 1, scalp x 1 Complexity: simple   Destruction method: cryotherapy   Informed consent: discussed and consent obtained   Timeout:  patient name, date of birth, surgical site, and procedure verified Lesion destroyed using liquid nitrogen: Yes   Region frozen until ice ball extended beyond lesion: Yes   Outcome: patient tolerated procedure well with no complications   Post-procedure details: wound  care instructions given     Return in about 6 months (around 07/22/2023) for AK f/u.  I, Ardis Rowan, RMA, am acting as scribe for Armida Sans, MD .   Documentation: I have reviewed the above documentation for accuracy and completeness, and I agree with the  above.  Armida Sans, MD

## 2023-01-19 NOTE — Patient Instructions (Addendum)
Cryotherapy Aftercare  Wash gently with soap and water everyday.   Apply Vaseline and Band-Aid daily until healed.     Due to recent changes in healthcare laws, you may see results of your pathology and/or laboratory studies on MyChart before the doctors have had a chance to review them. We understand that in some cases there may be results that are confusing or concerning to you. Please understand that not all results are received at the same time and often the doctors may need to interpret multiple results in order to provide you with the best plan of care or course of treatment. Therefore, we ask that you please give us 2 business days to thoroughly review all your results before contacting the office for clarification. Should we see a critical lab result, you will be contacted sooner.   If You Need Anything After Your Visit  If you have any questions or concerns for your doctor, please call our main line at [phone] and press option 4 to reach your doctor's medical assistant. If no one answers, please leave a voicemail as directed and we will return your call as soon as possible. Messages left after 4 pm will be answered the following business day.   You may also send us a message via MyChart. We typically respond to MyChart messages within 1-2 business days.  For prescription refills, please ask your pharmacy to contact our office. Our fax number is [phone].  If you have an urgent issue when the clinic is closed that cannot wait until the next business day, you can page your doctor at the number below.    Please note that while we do our best to be available for urgent issues outside of office hours, we are not available 24/7.   If you have an urgent issue and are unable to reach us, you may choose to seek medical care at your doctor's office, retail clinic, urgent care center, or emergency room.  If you have a medical emergency, please immediately call 911 or go to the  emergency department.  Pager Numbers  - Dr. Kowalski: [phone]  - Dr. Moye: [phone]  - Dr. Stewart: [phone]  In the event of inclement weather, please call our main line at [phone] for an update on the status of any delays or closures.  Dermatology Medication Tips: Please keep the boxes that topical medications come in in order to help keep track of the instructions about where and how to use these. Pharmacies typically print the medication instructions only on the boxes and not directly on the medication tubes.   If your medication is too expensive, please contact our office at [phone] option 4 or send us a message through MyChart.   We are unable to tell what your co-pay for medications will be in advance as this is different depending on your insurance coverage. However, we may be able to find a substitute medication at lower cost or fill out paperwork to get insurance to cover a needed medication.   If a prior authorization is required to get your medication covered by your insurance company, please allow us 1-2 business days to complete this process.  Drug prices often vary depending on where the prescription is filled and some pharmacies may offer cheaper prices.  The website www.goodrx.com contains coupons for medications through different pharmacies. The prices here do not account for what the cost may be with help from insurance (it may be cheaper with your insurance), but the website can   give you the price if you did not use any insurance.  - You can print the associated coupon and take it with your prescription to the pharmacy.  - You may also stop by our office during regular business hours and pick up a GoodRx coupon card.  - If you need your prescription sent electronically to a different pharmacy, notify our office through [facility] MyChart or by phone at [phone] option 4.     Si Usted Necesita Algo Despus de Su Visita  Tambin puede  enviarnos un mensaje a travs de MyChart. Por lo general respondemos a los mensajes de MyChart en el transcurso de 1 a 2 das hbiles.  Para renovar recetas, por favor pida a su farmacia que se ponga en contacto con nuestra oficina. Nuestro nmero de fax es el [phone].  Si tiene un asunto urgente cuando la clnica est cerrada y que no puede esperar hasta el siguiente da hbil, puede llamar/localizar a su doctor(a) al nmero que aparece a continuacin.   Por favor, tenga en cuenta que aunque hacemos todo lo posible para estar disponibles para asuntos urgentes fuera del horario de oficina, no estamos disponibles las 24 horas del da, los 7 das de la semana.   Si tiene un problema urgente y no puede comunicarse con nosotros, puede optar por buscar atencin mdica  en el consultorio de su doctor(a), en una clnica privada, en un centro de atencin urgente o en una sala de emergencias.  Si tiene una emergencia mdica, por favor llame inmediatamente al 911 o vaya a la sala de emergencias.  Nmeros de bper  - Dr. Kowalski: [phone]  - Dra. Moye: [phone]  - Dra. Stewart: [phone]  En caso de inclemencias del tiempo, por favor llame a nuestra lnea principal al [phone] para una actualizacin sobre el estado de cualquier retraso o cierre.  Consejos para la medicacin en dermatologa: Por favor, guarde las cajas en las que vienen los medicamentos de uso tpico para ayudarle a seguir las instrucciones sobre dnde y cmo usarlos. Las farmacias generalmente imprimen las instrucciones del medicamento slo en las cajas y no directamente en los tubos del medicamento.   Si su medicamento es muy caro, por favor, pngase en contacto con nuestra oficina llamando al [phone] y presione la opcin 4 o envenos un mensaje a travs de MyChart.   No podemos decirle cul ser su copago por los medicamentos por adelantado ya que esto es diferente dependiendo de la cobertura de su seguro.  Sin embargo, es posible que podamos encontrar un medicamento sustituto a menor costo o llenar un formulario para que el seguro cubra el medicamento que se considera necesario.   Si se requiere una autorizacin previa para que su compaa de seguros cubra su medicamento, por favor permtanos de 1 a 2 das hbiles para completar este proceso.  Los precios de los medicamentos varan con frecuencia dependiendo del lugar de dnde se surte la receta y alguna farmacias pueden ofrecer precios ms baratos.  El sitio web www.goodrx.com tiene cupones para medicamentos de diferentes farmacias. Los precios aqu no tienen en cuenta lo que podra costar con la ayuda del seguro (puede ser ms barato con su seguro), pero el sitio web puede darle el precio si no utiliz ningn seguro.  - Puede imprimir el cupn correspondiente y llevarlo con su receta a la farmacia.  - Tambin puede pasar por nuestra oficina durante el horario de atencin regular y recoger una tarjeta de cupones de GoodRx.  -   Si necesita que su receta se enve electrnicamente a una farmacia diferente, informe a nuestra oficina a travs de MyChart de [facility] o por telfono llamando al [phone] y presione la opcin 4.  

## 2023-02-01 ENCOUNTER — Other Ambulatory Visit: Payer: Self-pay | Admitting: Family Medicine

## 2023-02-01 ENCOUNTER — Encounter: Payer: Self-pay | Admitting: Dermatology

## 2023-02-01 DIAGNOSIS — N529 Male erectile dysfunction, unspecified: Secondary | ICD-10-CM

## 2023-02-03 MED ORDER — SILDENAFIL CITRATE 50 MG PO TABS: 50.0000 mg | ORAL_TABLET | Freq: Every day | ORAL | 0 refills | Status: DC | PRN

## 2023-02-03 NOTE — Telephone Encounter (Signed)
Last OV 09/30/2022 okay to refill Viagra?

## 2023-02-09 DIAGNOSIS — M5412 Radiculopathy, cervical region: Secondary | ICD-10-CM | POA: Diagnosis not present

## 2023-04-13 ENCOUNTER — Encounter (INDEPENDENT_AMBULATORY_CARE_PROVIDER_SITE_OTHER): Payer: Self-pay

## 2023-04-13 DIAGNOSIS — M5412 Radiculopathy, cervical region: Secondary | ICD-10-CM | POA: Diagnosis not present

## 2023-05-24 ENCOUNTER — Ambulatory Visit: Payer: BC Managed Care – PPO | Admitting: Family Medicine

## 2023-05-24 ENCOUNTER — Telehealth: Payer: Self-pay

## 2023-05-24 ENCOUNTER — Encounter: Payer: Self-pay | Admitting: Family Medicine

## 2023-05-24 VITALS — BP 118/78 | HR 76 | Temp 97.8°F | Ht 72.0 in | Wt 213.0 lb

## 2023-05-24 DIAGNOSIS — R079 Chest pain, unspecified: Secondary | ICD-10-CM | POA: Diagnosis not present

## 2023-05-24 DIAGNOSIS — Z125 Encounter for screening for malignant neoplasm of prostate: Secondary | ICD-10-CM | POA: Diagnosis not present

## 2023-05-24 DIAGNOSIS — M5412 Radiculopathy, cervical region: Secondary | ICD-10-CM | POA: Diagnosis not present

## 2023-05-24 DIAGNOSIS — E785 Hyperlipidemia, unspecified: Secondary | ICD-10-CM | POA: Diagnosis not present

## 2023-05-24 DIAGNOSIS — Z0001 Encounter for general adult medical examination with abnormal findings: Secondary | ICD-10-CM

## 2023-05-24 DIAGNOSIS — E663 Overweight: Secondary | ICD-10-CM

## 2023-05-24 DIAGNOSIS — N529 Male erectile dysfunction, unspecified: Secondary | ICD-10-CM

## 2023-05-24 LAB — HEMOGLOBIN A1C: Hgb A1c MFr Bld: 5.5 % (ref 4.6–6.5)

## 2023-05-24 LAB — COMPREHENSIVE METABOLIC PANEL
ALT: 28 U/L (ref 0–53)
AST: 19 U/L (ref 0–37)
Albumin: 4.5 g/dL (ref 3.5–5.2)
Alkaline Phosphatase: 64 U/L (ref 39–117)
BUN: 19 mg/dL (ref 6–23)
CO2: 26 mEq/L (ref 19–32)
Calcium: 9.6 mg/dL (ref 8.4–10.5)
Chloride: 103 mEq/L (ref 96–112)
Creatinine, Ser: 0.96 mg/dL (ref 0.40–1.50)
GFR: 92.47 mL/min (ref >60.00)
Glucose, Bld: 103 mg/dL — ABNORMAL HIGH (ref 70–99)
Potassium: 4.4 mEq/L (ref 3.5–5.1)
Sodium: 139 mEq/L (ref 135–145)
Total Bilirubin: 0.5 mg/dL (ref 0.2–1.2)
Total Protein: 7.4 g/dL (ref 6.0–8.3)

## 2023-05-24 LAB — LIPID PANEL
Cholesterol: 270 mg/dL — ABNORMAL HIGH (ref 0–200)
HDL: 35.7 mg/dL — ABNORMAL LOW (ref >39.00)
Total CHOL/HDL Ratio: 8
Triglycerides: 443 mg/dL — ABNORMAL HIGH (ref 0.0–149.0)

## 2023-05-24 LAB — LDL CHOLESTEROL, DIRECT: Direct LDL: 169 mg/dL

## 2023-05-24 LAB — PSA: PSA: 0.68 ng/mL (ref 0.10–4.00)

## 2023-05-24 MED ORDER — SILDENAFIL CITRATE 50 MG PO TABS: 50.0000 mg | ORAL_TABLET | Freq: Every day | ORAL | 0 refills | Status: DC | PRN

## 2023-05-24 NOTE — Patient Instructions (Addendum)
Nice to see you. If you have recurrent chest pain please let us know. Will get lab work today and contact you with the results. Try to avoid alcoholic beverages and caffeinated beverages near bedtime.

## 2023-05-24 NOTE — Progress Notes (Signed)
Philip Alar, MD Phone: [phone]  Philip Reynolds is a 52 y.o. male who presents today for CPE.  Diet: Generally good, does have 1 soda per day, no sweet tea, does get plenty of vegetables Exercise: Body weight exercises and some cardio 5 times a week Colonoscopy: Due 12/18/2023 Prostate cancer screening: Due Family history-  Prostate cancer: no  Colon cancer: no Vaccines-   Flu: declines  Tetanus: UTD  Shingles: due  COVID19: declined in past HIV screening: declined in past  Hep C Screening: declined in past Tobacco use: no Alcohol use: 8-10 per week Illicit Drug use: no Dentist: yes Ophthalmology: periodically  Chest pain: Patient notes he woke up last night with a tight sensation in his chest.  He notes it lasted about half the night.  Notes he has had similar symptoms in the past that he related to something he ate or drank though last night was a little different.  He had an alcoholic drink with dinner and had a [facility] late in the afternoon.  He notes intermittent issues with chest tightness that typically resolves with Tums and elevation.  Occasionally he will get a sour taste like reflux.  He noted no shortness of breath, radiation, or diaphoresis with this episode.  Cervical radiculopathy: Patient has been seeing neurosurgery.  He notes they have recommended a disc replacement and he will probably have this done sometime next year.  Patient wonders if I know anything about the disc replacement surgery.   Active Ambulatory Problems    Diagnosis Date Noted   Basal cell carcinoma 05/28/2009   Hyperlipidemia 09/30/2008   Lipoma of skin and subcutaneous tissue 05/18/2011   Encounter for general adult medical examination with abnormal findings 07/18/2013   Herpesviral infection 10/10/2014   Nevus 03/09/2017   Subcutaneous nodule 03/20/2019   Vision changes 03/20/2019   Erectile dysfunction 10/30/2019   Zoster without complications 05/18/2011   Polyp of  transverse colon    Cecal polyp    Neck and shoulder pain 03/18/2021   Left shoulder pain 05/18/2021   Overweight 05/18/2021   Skin lesions 05/18/2021   Rash 05/18/2022   Cervical radiculopathy 09/30/2022   Chest pain 05/24/2023   Resolved Ambulatory Problems    Diagnosis Date Noted   UMBILICAL HERNIA 05/28/2009   Right inguinal hernia 07/18/2013   Umbilical hernia 07/30/2013   Atypical chest pain 03/20/2019   Abnormal breathing 10/30/2019   Left hand pain 05/18/2020   Benign lipomatous neoplasm of skin and subcutaneous tissue 05/18/2011   Encounter for screening colonoscopy    Past Medical History:  Diagnosis Date   Actinic keratosis    Basal cell carcinoma of back    Herpes infection 10/10/2014   Herpetic whitlow    Other and unspecified hyperlipidemia     Family History  Problem Relation Age of Onset   Diabetes Father    Stroke Father        Post radiation for brain tumor   Cancer Father        Brain tumor   Depression Father    Kidney disease Neg Hx    Alcohol abuse Neg Hx    Drug abuse Neg Hx    CAD Neg Hx     Social History   Socioeconomic History   Marital status: Divorced    Spouse name: Not on file   Number of children: 0   Years of education: Not on file   Highest education level: Not on file  Occupational History  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   Activity: regular gym 5d /wk   Diet: tries to eat healthy, avoids fast food, good water, fruits/vegetables daily   Social Determinants of Health   Financial Resource Strain: Not on file  Food Insecurity: Not on file  Transportation Needs: Not on  file  Physical Activity: Not on file  Stress: Not on file  Social Connections: Not on file  Intimate Partner Violence: Not on file    ROS  General:  Negative for nexplained weight loss, fever Skin: Negative for new or changing mole, sore that won't heal HEENT: Negative for trouble hearing, trouble seeing, ringing in ears, mouth sores, hoarseness, change in voice, dysphagia. CV: Positive for chest pain, negative for dyspnea, edema, palpitations Resp: Negative for cough, dyspnea, hemoptysis GI: Negative for nausea, vomiting, diarrhea, constipation, abdominal pain, melena, hematochezia. GU: Negative for dysuria, incontinence, urinary hesitance, hematuria, vaginal or penile discharge, polyuria, sexual difficulty, lumps in testicle or breasts MSK: Negative for muscle cramps or aches, joint pain or swelling Neuro: Negative for headaches, weakness, numbness, dizziness, passing out/fainting Psych: Negative for depression, anxiety, memory problems  Objective  Physical Exam Vitals:   05/24/23 0905  BP: 118/78  Pulse: 76  Temp: 97.8 F (36.6 C)  SpO2: 96%    BP Readings from Last 3 Encounters:  05/24/23 118/78  01/19/23 127/89  09/30/22 110/80   Wt Readings from Last 3 Encounters:  05/24/23 213 lb (96.6 kg)  09/30/22 208 lb 9.6 oz (94.6 kg)  05/18/22 205 lb (93 kg)    Physical Exam Constitutional:      General: He is not in acute distress.    Appearance: He is not diaphoretic.  HENT:     Head: Normocephalic and atraumatic.  Cardiovascular:     Rate and Rhythm: Normal rate and regular rhythm.     Heart sounds: Normal heart sounds.  Pulmonary:     Effort: Pulmonary effort is normal.     Breath sounds: Normal breath sounds.  Abdominal:     General: Bowel sounds are normal. There is no distension.     Palpations: Abdomen is soft.     Tenderness: There is no abdominal tenderness.  Musculoskeletal:     Left lower leg: No edema.  Lymphadenopathy:     Cervical: No cervical  adenopathy.  Skin:    General: Skin is warm and dry.  Neurological:     Mental Status: He is alert.  Psychiatric:        Mood and Affect: Mood normal.    EKG: Sinus rhythm, rate 72, no ischemic changes  Assessment/Plan:   Encounter for general adult medical examination with abnormal findings Assessment & Plan: Physical exam completed.  Encouraged continued healthy diet and exercise.  Discussed reducing sugary drink intake.  PSA will be ordered with labs.  He will be given his first Shingrix vaccine today.  He declines flu vaccination today.  Encouraged him to see an ophthalmologist yearly.  Lab work as outlined.   Chest pain, unspecified type Assessment & Plan: Patient has had recurrent issues with some chest  pain that seems to be GI related.  Discussed it would be much less likely to be cardiac in nature given his description and the events surrounding this.  EKG completed today is reassuring.  Discussed not having alcoholic beverages or caffeine so close to bedtime to see if that would be beneficial.  Orders: -     EKG 12-Lead  Cervical radiculopathy Assessment & Plan: Chronic issue.  Patient will continue to see neurosurgery.  Discussed I did not know much about the disc replacement surgery.   Prostate cancer screening -     PSA  Hyperlipidemia, unspecified hyperlipidemia type -     Comprehensive metabolic panel -     Lipid panel  Overweight -     Hemoglobin A1c  Vasculogenic erectile dysfunction, unspecified vasculogenic erectile dysfunction type -     Sildenafil Citrate; Take 1 tablet (50 mg total) by mouth daily as needed for erectile dysfunction.  Dispense: 90 tablet; Refill: 0    Return in about 1 year (around 05/23/2024) for physical.   Philip Alar, MD [facility]

## 2023-05-24 NOTE — Telephone Encounter (Signed)
Left message for Patient to call back and schedule a shingles vaccine on the nurse schedule.

## 2023-05-24 NOTE — Assessment & Plan Note (Signed)
Physical exam completed.  Encouraged continued healthy diet and exercise.  Discussed reducing sugary drink intake.  PSA will be ordered with labs.  He will be given his first Shingrix vaccine today.  He declines flu vaccination today.  Encouraged him to see an ophthalmologist yearly.  Lab work as outlined.

## 2023-05-24 NOTE — Assessment & Plan Note (Signed)
Patient has had recurrent issues with some chest pain that seems to be GI related.  Discussed it would be much less likely to be cardiac in nature given his description and the events surrounding this.  EKG completed today is reassuring.  Discussed not having alcoholic beverages or caffeine so close to bedtime to see if that would be beneficial.

## 2023-05-24 NOTE — Telephone Encounter (Signed)
Left message to call the office back regarding lab results and to schedule a nurse visit for a Shingles vaccine.

## 2023-05-24 NOTE — Telephone Encounter (Signed)
-----   Message from Marikay Alar sent at 05/24/2023  3:31 PM EDT ----- Please let the patient know that his LDL cholesterol is elevated.  He is in an intermediate risk category where he could benefit from a statin to help reduce his risk of stroke and heart attack.  If he is willing to try a statin I can send one to his pharmacy and he would need lab work rechecked in 6 weeks.  If he does not want to do a statin he needs to continue to work on dietary changes and exercise.  Can you also find out if he was fasting for this test?  His other labs are acceptable.  The 10-year ASCVD risk score (Arnett DK, et al., 2019) is: 6.5%   Values used to calculate the score:     Age: 52 years     Sex: Male     Is Non-Hispanic African American: No     Diabetic: No     Tobacco smoker: No     Systolic Blood Pressure: 118 mmHg     Is BP treated: No     HDL Cholesterol: 35.7 mg/dL     Total Cholesterol: 270 mg/dL

## 2023-05-24 NOTE — Assessment & Plan Note (Signed)
Chronic issue.  Patient will continue to see neurosurgery.  Discussed I did not know much about the disc replacement surgery.

## 2023-05-25 NOTE — Telephone Encounter (Signed)
Noted  

## 2023-05-25 NOTE — Telephone Encounter (Signed)
Patient states he is returning our call.  I read messages from Prince Solian, CMA, and Dr. Marikay Alar.  Patient states he was fasting for his labs.  Patient states he would like to do some research on the statin, and in the meantime he will be working on his diet and exercise.  Patient states he would like to wait to schedule his shingles vaccine.

## 2023-07-11 ENCOUNTER — Ambulatory Visit: Payer: BC Managed Care – PPO | Admitting: Dermatology

## 2023-07-11 DIAGNOSIS — L570 Actinic keratosis: Secondary | ICD-10-CM

## 2023-07-11 DIAGNOSIS — Z79899 Other long term (current) drug therapy: Secondary | ICD-10-CM

## 2023-07-11 DIAGNOSIS — W908XXA Exposure to other nonionizing radiation, initial encounter: Secondary | ICD-10-CM

## 2023-07-11 DIAGNOSIS — L738 Other specified follicular disorders: Secondary | ICD-10-CM | POA: Diagnosis not present

## 2023-07-11 DIAGNOSIS — L578 Other skin changes due to chronic exposure to nonionizing radiation: Secondary | ICD-10-CM | POA: Diagnosis not present

## 2023-07-11 DIAGNOSIS — L649 Androgenic alopecia, unspecified: Secondary | ICD-10-CM | POA: Diagnosis not present

## 2023-07-11 DIAGNOSIS — Z85828 Personal history of other malignant neoplasm of skin: Secondary | ICD-10-CM

## 2023-07-11 DIAGNOSIS — Z7189 Other specified counseling: Secondary | ICD-10-CM

## 2023-07-11 MED ORDER — MINOXIDIL 2.5 MG PO TABS: 2.5000 mg | ORAL_TABLET | Freq: Every day | ORAL | 1 refills | Status: DC

## 2023-07-11 NOTE — Patient Instructions (Signed)

## 2023-07-11 NOTE — Progress Notes (Signed)
Follow-Up Visit   Subjective  Philip Reynolds is a 52 y.o. male who presents for the following: AK recheck of sun exposed areas S/P LN2.   The patient has spots, moles and lesions to be evaluated, some may be new or changing and the patient may have concern these could be cancer.   The following portions of the chart were reviewed this encounter and updated as appropriate: medications, allergies, medical history  Review of Systems:  No other skin or systemic complaints except as noted in HPI or Assessment and Plan.  Objective  Well appearing patient in no apparent distress; mood and affect are within normal limits.   A focused examination was performed of the following areas: the face, ears, arms, and hands   Relevant exam findings are noted in the Assessment and Plan.  Scalp and forehead x 5 (5) Pink scaly macules    Assessment & Plan   ACTINIC DAMAGE - chronic, secondary to cumulative UV radiation exposure/sun exposure over time - diffuse scaly erythematous macules with underlying dyspigmentation - Recommend daily broad spectrum sunscreen SPF 30+ to sun-exposed areas, reapply every 2 hours as needed.  - Recommend staying in the shade or wearing long sleeves, sun glasses (UVA+UVB protection) and wide brim hats (4-inch brim around the entire circumference of the hat). - Call for new or changing lesions.  AK (actinic keratosis) (5) Scalp and forehead x 5  Actinic keratoses are precancerous spots that appear secondary to cumulative UV radiation exposure/sun exposure over time. They are chronic with expected duration over 1 year. A portion of actinic keratoses will progress to squamous cell carcinoma of the skin. It is not possible to reliably predict which spots will progress to skin cancer and so treatment is recommended to prevent development of skin cancer.  Recommend daily broad spectrum sunscreen SPF 30+ to sun-exposed areas, reapply every 2 hours as needed.   Recommend staying in the shade or wearing long sleeves, sun glasses (UVA+UVB protection) and wide brim hats (4-inch brim around the entire circumference of the hat). Call for new or changing lesions.   Destruction of lesion - Scalp and forehead x 5 (5) Complexity: simple   Destruction method: cryotherapy   Informed consent: discussed and consent obtained   Timeout:  patient name, date of birth, surgical site, and procedure verified Lesion destroyed using liquid nitrogen: Yes   Region frozen until ice ball extended beyond lesion: Yes   Outcome: patient tolerated procedure well with no complications   Post-procedure details: wound care instructions given    Sebaceous Hyperplasia - Small yellow papules with a central dell - Benign-appearing - Observe. Call for changes.  HISTORY OF BASAL CELL CARCINOMA OF THE SKIN - No evidence of recurrence today - Recommend regular full body skin exams - Recommend daily broad spectrum sunscreen SPF 30+ to sun-exposed areas, reapply every 2 hours as needed.  - Call if any new or changing lesions are noted between office visits  ANDROGENETIC ALOPECIA (MALE PATTERN HAIR LOSS)               Exam: Frontal scalp thinning with intact frontal hairline and miniaturization   Chronic and persistent condition with duration or expected duration over one year. Condition is symptomatic/ bothersome to patient. Not currently at goal.  Androgenetic Alopecia (or Male pattern hair loss) refers to the common patterned hair loss affecting many men.  Male pattern alopecia is mediated by dihydrotestosterone which induces miniaturization of androgen-sensitive hair follicles.  It is chronic  and persistent, but treatable; not curable. Topical treatment includes: - 5% topical Minoxidil Oral treatment includes: - Finasteride 1 mg qd - Minoxidil 1.25 - 5 mg qd - Dutasteride 0.5 mg qd Adjunct therapy includes: - Low Level Laser Light Therapy (LLLT) -  Platelet-rich Plasma injections (PRP) - Hair Transplantation or scalp reduction  Treatment Plan: Start Minoxidil 2.5 mg po QD. Doses of minoxidil for hair loss are considered 'low dose'. This is because the doses used for hair loss are much lower than the doses which are used for conditions such as high blood pressure (hypertension). The doses used for hypertension are 10-40mg  per day.  Side effects are uncommon at the low doses (up to 2.5 mg/day) used to treat hair loss. Potential side effects, more commonly seen at higher doses, include: Increase in hair growth (hypertrichosis) elsewhere on face and body Temporary hair shedding upon starting medication which may last up to 4 weeks Ankle swelling, fluid retention, rapid weight gain more than 5 pounds Low blood pressure and feeling lightheaded or dizzy when standing up quickly Fast or irregular heartbeat Headaches  Long term medication management.  Patient is using long term (months to years) prescription medication  to control their dermatologic condition.  These medications require periodic monitoring to evaluate for efficacy and side effects and may require periodic laboratory monitoring.    Return for TBSE in 6-8 mths.  Maylene Roes, CMA, am acting as scribe for Armida Sans, MD .   Documentation: I have reviewed the above documentation for accuracy and completeness, and I agree with the above.  Armida Sans, MD

## 2023-07-13 DIAGNOSIS — M5412 Radiculopathy, cervical region: Secondary | ICD-10-CM | POA: Diagnosis not present

## 2023-07-18 ENCOUNTER — Encounter: Payer: Self-pay | Admitting: Dermatology

## 2023-11-09 ENCOUNTER — Telehealth: Payer: Self-pay | Admitting: Family Medicine

## 2023-11-09 NOTE — Telephone Encounter (Signed)
 Left message with patient for need to schedule TOC appt. Bad signal, he will call back tomorrow. [facility]

## 2023-12-18 ENCOUNTER — Telehealth: Payer: Self-pay

## 2023-12-18 NOTE — Telephone Encounter (Signed)
 Copied from CRM [id number]. Topic: Appointments - Transfer of Care >> Dec 18, 2023  3:35 PM [name] wrote: Pt is requesting to transfer FROM:  Philip Reynolds, Philip Reynolds Pt is requesting to transfer TO: Deborra Falter Reason for requested transfer: PROVIDER NO LONGER THERE It is the responsibility of the team the patient would like to transfer to (Dr. Deborra Falter) to reach out to the patient if for any reason this transfer is not acceptable.

## 2023-12-18 NOTE — Telephone Encounter (Signed)
 Copied from CRM [id number]. Topic: Appointments - Transfer of Care >> Dec 18, 2023  3:35 PM Dyann Glaser G wrote: Pt is requesting to transfer FROM:  Lovetta Rucks, ERIC G Pt is requesting to transfer TO: Deborra Falter Reason for requested transfer: PROVIDER NO LONGER THERE It is the responsibility of the team the patient would like to transfer to (Dr. Deborra Falter) to reach out to the patient if for any reason this transfer is not acceptable.

## 2023-12-19 NOTE — Telephone Encounter (Signed)
 Noted.

## 2024-02-07 ENCOUNTER — Ambulatory Visit: Payer: BC Managed Care – PPO | Admitting: Dermatology

## 2024-02-08 ENCOUNTER — Ambulatory Visit (INDEPENDENT_AMBULATORY_CARE_PROVIDER_SITE_OTHER)
Admission: RE | Admit: 2024-02-08 | Discharge: 2024-02-08 | Disposition: A | Discharge status: Home / Self Care | Source: Ambulatory Visit | Attending: General Practice | Admitting: General Practice

## 2024-02-08 ENCOUNTER — Ambulatory Visit: Admitting: General Practice

## 2024-02-08 ENCOUNTER — Encounter: Payer: Self-pay | Admitting: General Practice

## 2024-02-08 VITALS — BP 110/76 | HR 80 | Temp 98.0°F | Ht 71.25 in | Wt 210.0 lb

## 2024-02-08 DIAGNOSIS — K4090 Unilateral inguinal hernia, without obstruction or gangrene, not specified as recurrent: Secondary | ICD-10-CM

## 2024-02-08 DIAGNOSIS — M25561 Pain in right knee: Secondary | ICD-10-CM

## 2024-02-08 DIAGNOSIS — N529 Male erectile dysfunction, unspecified: Secondary | ICD-10-CM | POA: Diagnosis not present

## 2024-02-08 DIAGNOSIS — G8929 Other chronic pain: Secondary | ICD-10-CM

## 2024-02-08 DIAGNOSIS — Z1211 Encounter for screening for malignant neoplasm of colon: Secondary | ICD-10-CM

## 2024-02-08 DIAGNOSIS — Z23 Encounter for immunization: Secondary | ICD-10-CM

## 2024-02-08 DIAGNOSIS — M25562 Pain in left knee: Secondary | ICD-10-CM

## 2024-02-08 DIAGNOSIS — M1711 Unilateral primary osteoarthritis, right knee: Secondary | ICD-10-CM | POA: Diagnosis not present

## 2024-02-08 DIAGNOSIS — Z7689 Persons encountering health services in other specified circumstances: Secondary | ICD-10-CM | POA: Insufficient documentation

## 2024-02-08 MED ORDER — SILDENAFIL CITRATE 50 MG PO TABS: 50.0000 mg | ORAL_TABLET | Freq: Every day | ORAL | 0 refills | Status: DC | PRN

## 2024-02-08 NOTE — Progress Notes (Signed)
 New Patient Office Visit  Subjective    Patient ID: Philip Reynolds, male    DOB: 1970/11/17  Age: 53 y.o. MRN: [id number]  CC:  Chief Complaint  Patient presents with   Transfer of Care    Previous pt of Dr Lovetta Rucks.  Needs help getting colonoscopy scheduled Bilateral knee pain. Left is worse.  Has had inguinal hernia repair and umbilical hernia repair multiple years back. States when he is doing lunges, he is starting to feel some discomfort.     HPI Philip Reynolds is a 53 y.o. male presents to establish care.   Last physical/labs/pcp: September, 2024. Dr. Lovetta Rucks.   Bilateral knee pain: symptom onset was 6 month. In the past, right knee was worst but he had surgery and had ACL repaired. Left knee has been worse lately especially at rest. He feels like he irritated it after running and it locked up on him. No radiating pain. No swelling. Pain is constant especially when laying down. Constant aching pain with periods of sharp stabbing pain. No recent trauma or injury. He has tried ibuprofen without any relief.   ED: needs refill for Viagra . Denies any chest pain, shortness of breath or difficulty breathing.   Hx of Inguinal and Umbilical hernia in 2014: has noticed that he is having pain again in the area where he had the repair. He does not feel any bulge like the hernia. He only feels the pain when he is doing lunges. Pain is intermittent. Denies any urinary symptoms.   Outpatient Encounter Medications as of 02/08/2024  Medication Sig   minoxidil  (LONITEN ) 2.5 MG tablet Take 1 tablet (2.5 mg total) by mouth daily.   [DISCONTINUED] sildenafil  (VIAGRA ) 50 MG tablet Take 1 tablet (50 mg total) by mouth daily as needed for erectile dysfunction.   gabapentin  (NEURONTIN ) 300 MG capsule Take 1 capsule (300 mg total) by mouth at bedtime. (Patient not taking: Reported on 02/08/2024)   sildenafil  (VIAGRA ) 50 MG tablet Take 1 tablet (50 mg total) by mouth daily as needed for  erectile dysfunction.   [DISCONTINUED] sildenafil  (VIAGRA ) 50 MG tablet Take 1 tablet (50 mg total) by mouth daily as needed for erectile dysfunction.   No facility-administered encounter medications on file as of 02/08/2024.    Past Medical History:  Diagnosis Date   Actinic keratosis    Basal cell carcinoma 02/16/2022   R post base of neck, superfical, needs EDC   Basal cell carcinoma of back    Herpes infection 10/10/2014   Herpetic whitlow    left ring finger; Dr. Neomi Banks   Other and unspecified hyperlipidemia    Umbilical hernia without mention of obstruction or gangrene     Past Surgical History:  Procedure Laterality Date   ANTERIOR CRUCIATE LIGAMENT REPAIR (aka ACL) and meniscectomy  02/2007   Right knee (Dr. Valinda Gault)   COLONOSCOPY WITH PROPOFOL  N/A 12/22/2020   Procedure: COLONOSCOPY WITH POLYPECTOMY;  Surgeon: Irby Mannan, MD;  Location: [facility];  Service: Endoscopy;  Laterality: N/A;  priority 4   HERNIA REPAIR Right 08/06/13   inguinal hernia   HERNIA REPAIR  08/06/13   umbilical hernia   Herpetic Whitlow  04/1997   Left hand followed by Dr. 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   Activity: regular gym 5d /wk   Diet: tries to eat healthy, avoids fast food, good water , fruits/vegetables daily   Social Drivers of Corporate investment banker Strain: Not on file  Food Insecurity: Not on file  Transportation Needs: Not on file  Physical Activity: Not on file  Stress: Not on file  Social Connections: Not on file  Intimate Partner Violence: Not on file    Review of Systems  Constitutional:  Negative for chills and fever.  Respiratory:  Negative for shortness of breath.   Cardiovascular:  Negative for chest pain.  Gastrointestinal:  Negative for abdominal pain, constipation, diarrhea, heartburn, nausea and vomiting.  Genitourinary:  Negative for dysuria, frequency and urgency.  Musculoskeletal:  Positive for joint pain.  Neurological:  Negative for dizziness and headaches.  Endo/Heme/Allergies:  Negative for polydipsia.  Psychiatric/Behavioral:  Negative for depression and suicidal ideas. The patient is not nervous/anxious.         Objective    BP 110/76 (BP Location: Left Arm, Patient Position: Sitting, Cuff Size: Large)   Pulse 80   Temp 98 F (36.7 C) (Oral)   Ht 5' 11.25 (1.81 m)   Wt 210 lb (95.3 kg)   SpO2 96%   BMI 29.08 kg/m   Physical Exam Vitals and nursing note reviewed.  Constitutional:      Appearance: Normal appearance.   Cardiovascular:     Rate and Rhythm: Normal rate and regular rhythm.     Pulses: Normal pulses.     Heart sounds: Normal heart sounds.  Pulmonary:     Effort: Pulmonary effort is normal.     Breath sounds: Normal breath sounds.   Musculoskeletal:     Right knee: Normal range of motion.     Left knee: No swelling or deformity. Decreased range of motion.   Skin:    General: Skin is warm.   Neurological:     Mental Status: He is alert and oriented to person, place, and time.   Psychiatric:        Mood and Affect: Mood  normal.        Behavior: Behavior normal.        Thought Content: Thought content normal.        Judgment: Judgment normal.         Assessment & Plan:  Chronic pain of both knees Assessment & Plan: Unclear etiology.   Left knee- Given that the pain is only when laying down - will get x-ray to rule out any acute process.   Right knee-  suspect OA. Will get x-ray to rule out any acute process.   Await results.  Consider appt with Dr. Geralyn Knee.  Orders: -     DG Knee Complete 4 Views Left -     DG Knee Complete 4 Views Right  Vasculogenic erectile dysfunction, unspecified vasculogenic erectile dysfunction type Assessment & Plan: Controlled.   Refill sent for Viagra .   Orders: -     Sildenafil  Citrate; Take 1 tablet (50 mg total) by mouth daily as needed for erectile dysfunction.  Dispense: 90 tablet; Refill: 0  Unilateral inguinal hernia without obstruction or gangrene, recurrence not specified Assessment & Plan: Uncontrolled.   He is concerned about MESH used during surgery years ago and would like to be evaluated by surgeon.   Referral placed for surgery.  Will continue to monitor.  Orders: -     Ambulatory referral to General Surgery  Screening for colon cancer -     Ambulatory referral to Gastroenterology  Transfer requested for continuity of care Assessment & Plan: EMR reviewed briefly.    Need for Td vaccine -     Td vaccine greater than or equal to 7yo preservative free IM     Return in about 3 months (around 05/24/2024) for physical and fasting labs (same day).   Jolanda Nation, NP

## 2024-02-08 NOTE — Assessment & Plan Note (Signed)
 Controlled.   Refill sent for Viagra .

## 2024-02-08 NOTE — Patient Instructions (Addendum)
 Stop by the lab prior to leaving today. I will notify you of your results once received.   You will either be contacted via phone regarding your referral to general surgery and colonoscopy, or you may receive a letter on your MyChart portal from our referral team with instructions for scheduling an appointment. Please let us  know if you have not been contacted by anyone within two weeks.  Refill sent for Viagra .   Follow up in 3 months for physical and labs.   It was a pleasure to meet you today! Please don't hesitate to contact me with any questions. Welcome to [facility]!

## 2024-02-08 NOTE — Assessment & Plan Note (Signed)
 Uncontrolled.   He is concerned about MESH used during surgery years ago and would like to be evaluated by surgeon.   Referral placed for surgery.  Will continue to monitor.

## 2024-02-08 NOTE — Assessment & Plan Note (Signed)
 EMR reviewed briefly.

## 2024-02-08 NOTE — Assessment & Plan Note (Signed)
 Unclear etiology.   Left knee- Given that the pain is only when laying down - will get x-ray to rule out any acute process.   Right knee- suspect OA. Will get x-ray to rule out any acute process.   Await results.  Consider appt with Dr. Geralyn Knee.

## 2024-02-11 ENCOUNTER — Ambulatory Visit: Payer: Self-pay | Admitting: General Practice

## 2024-02-13 ENCOUNTER — Ambulatory Visit: Admitting: Dermatology

## 2024-02-21 ENCOUNTER — Ambulatory Visit: Payer: Self-pay | Admitting: Surgery

## 2024-02-21 ENCOUNTER — Encounter: Payer: Self-pay | Admitting: Surgery

## 2024-02-21 VITALS — BP 124/80 | HR 99 | Temp 97.5°F | Ht 72.0 in | Wt 213.8 lb

## 2024-02-21 DIAGNOSIS — R1032 Left lower quadrant pain: Secondary | ICD-10-CM

## 2024-02-21 DIAGNOSIS — K4091 Unilateral inguinal hernia, without obstruction or gangrene, recurrent: Secondary | ICD-10-CM | POA: Diagnosis not present

## 2024-02-21 DIAGNOSIS — K4090 Unilateral inguinal hernia, without obstruction or gangrene, not specified as recurrent: Secondary | ICD-10-CM

## 2024-02-21 NOTE — Patient Instructions (Addendum)
 Your Ultrasound is scheduled for 02/26/2024 at 10:30 am (arrive by 10:15 am) [facility].   Groin Hernia (Inguinal Hernia) in Adults: What to Know  A hernia happens when an organ or tissue in your body pushes out through a weak spot in the muscles of your belly. This makes a bulge. A groin hernia is also called an inguinal hernia. It's found in your groin, which is the area where your leg meets your lower belly. This kind of hernia could also be: In your scrotum, if you're male. In the folds of skin around your vagina, if you're male. You may be able to push the bulge back into your belly. If you can't push it in and blood flow is cut off to the hernia, you'll need surgery right away. What are the causes? A groin hernia may happen when you strain your belly muscles, such as when you: Lift a heavy object. Strain to poop. Cough. What increases the risk? You may be more likely to get a groin hernia if: You're male. You're 50 years or older. You're pregnant. You've had a groin hernia or belly surgery before. You smoke. You're overweight. You work at a job where you need to stand a lot or lift heavy things. What are the signs or symptoms? Symptoms may depend on how big the hernia is. If it's small, you may not have symptoms. If it's bigger, you may have: A bulge near your groin or genitals. Pain or burning in your groin. A dull ache or feeling of pressure in your groin. If blood flow is cut off to the tissues inside the hernia, you may also: Feel pain and tenderness when you touch the bulge. The skin over it may turn red or purple. Have a fever. Throw up or feel like you may throw up. Have trouble pooping or passing gas. How is this treated? Treatment depends on how big the hernia is and what symptoms you have. You may need: To be watched to see if the bulge grows bigger. Surgery. This may be done if the hernia is big or if you have symptoms. Follow these instructions at  home: Lifestyle Ask if it's OK for you to lift. Try not to stand for long periods of time. Do not smoke, vape, or use nicotine or tobacco. Stay at a healthy weight. Try not to do things that put pressure on your hernia. Preventing trouble pooping You may need to take these steps to help prevent or treat trouble pooping (constipation): Take medicines to help you poop. Eat foods high in fiber, like beans, whole grains, and fresh fruits and vegetables. Drink more fluids as told. General instructions Try to push the hernia back in place by very gently pressing on it while lying down. Do not try to force it back in if it won't push in easily. Watch your hernia for any changes in: Shape. Size. Color. Take your medicines only as told. Contact a doctor if: You have a fever. You have new symptoms. Your symptoms get worse. You can't poop or pass gas. Get help right away if: Your bulge: Starts to hurt a lot. Changes color. You have sudden pain in your scrotum, or your scrotum changes size. You can't gently push the hernia back in place. You feel like you may vomit, and that feeling does not go away. You keep throwing up or feeling like you need to throw up. These symptoms may be an emergency. Call 911 right away. Do not wait  to see if the symptoms will go away. Do not drive yourself to the hospital. This information is not intended to replace advice given to you by your health care provider. Make sure you discuss any questions you have with your health care provider. Document Revised: 04/13/2023 Document Reviewed: 04/13/2023 Elsevier Patient Education  2024 [employer].

## 2024-02-21 NOTE — Progress Notes (Unsigned)
 02/21/2024  Reason for Visit:  Left groin pain  Requesting Provider:  Carrol Aurora, NP  History of Present Illness: Philip Reynolds is a 53 y.o. male presenting for evaluation of left groin pain.  The patient has a history of a laparoscopic right inguinal hernia repair and open umbilical hernia repair with Dr. Dellie on 08/06/2013.  The patient reports that over the last few months he has noted pain happening in the left groin when doing lunges as part of his workout routine.  He does not feel pain when doing other exercises like squats or sit ups but mostly with lunges.  He has been holding off on doing that recently just to prevent any further issues.  He has not noticed any issues on the right groin or at the umbilicus.  When talking with his PCP about this, he thought that his hernia repair had been on the left side but looking more deeply into the notes from Dr. Dellie, his initial evaluation with him was for right inguinal hernia and the surgery itself and the operative note says right inguinal hernia.  The operative note does mention a small dimple in the left groin region but there was no full hernia at that time so no repair was done in the region.  The patient denies any nausea, vomiting, any other areas of abdominal pain.  Past Medical History: Past Medical History:  Diagnosis Date   Actinic keratosis    Basal cell carcinoma 02/16/2022   R post base of neck, superfical, needs EDC   Basal cell carcinoma of back    Herpes infection 10/10/2014   Herpetic whitlow    left ring finger; Dr. Tona   Other and unspecified hyperlipidemia    Umbilical hernia without mention of obstruction or gangrene      Past Surgical History: Past Surgical History:  Procedure Laterality Date   ANTERIOR CRUCIATE LIGAMENT REPAIR (aka ACL) and meniscectomy  02/2007   Right knee (Dr. Memory)   COLONOSCOPY WITH PROPOFOL  N/A 12/22/2020   Procedure: COLONOSCOPY WITH POLYPECTOMY;  Surgeon: Janalyn Keene NOVAK, MD;  Location: [facility];  Service: Endoscopy;  Laterality: N/A;  priority 4   HERNIA REPAIR Right 08/06/13   inguinal hernia   HERNIA REPAIR  08/06/13   umbilical hernia   Herpetic Whitlow  04/1997   Left hand followed by Dr. Francia at [facility] Medications: Prior to Admission medications   Medication Sig Start Date End Date Taking? Authorizing Provider  gabapentin  (NEURONTIN ) 300 MG capsule Take 1 capsule (300 mg total) by mouth at bedtime. 12/19/22  Yes Maribeth Camellia MATSU, MD  minoxidil  (LONITEN ) 2.5 MG tablet Take 1 tablet (2.5 mg total) by mouth daily. 07/11/23  Yes Hester Alm BROCKS, MD  sildenafil  (VIAGRA ) 50 MG tablet Take 1 tablet (50 mg total) by mouth daily as needed for erectile dysfunction. 02/08/24  Yes Aurora Carrol, NP    Allergies: No Known Allergies  Social History:  reports that he has never smoked. He has never used smokeless tobacco. He reports current alcohol use. He reports that he does not use drugs.   Family History: Family History  Problem Relation Age of Onset   Diabetes Father    Stroke Father        Post radiation for brain tumor   Cancer Father        Brain tumor   Depression Father    Kidney disease Neg Hx    Alcohol abuse Neg Hx  Drug abuse Neg Hx    CAD Neg Hx     Review of Systems: Review of Systems  Constitutional:  Negative for chills and fever.  Respiratory:  Negative for shortness of breath.   Cardiovascular:  Negative for chest pain.  Gastrointestinal:  Positive for abdominal pain. Negative for nausea and vomiting.    Physical Exam BP 124/80   Pulse 99   Temp (!) 97.5 F (36.4 C) (Oral)   Ht 6' (1.829 m)   Wt 213 lb 12.8 oz (97 kg)   SpO2 95%   BMI 29.00 kg/m  CONSTITUTIONAL: No acute distress, well-nourished HEENT:  Normocephalic, atraumatic, extraocular motion intact. RESPIRATORY:  Lungs are clear, and breath sounds are equal bilaterally. Normal respiratory effort without pathologic use of  accessory muscles. CARDIOVASCULAR: Heart is regular without murmurs, gallops, or rubs. GI: The abdomen is soft, nondistended, currently nontender to palpation.  In the left groin, I do not feel any bulging both in standing or lying down position with both coughing or Valsalva.  In the right groin, there may be a subtle finding of a recurrence particularly inferiorly.  However it is not reproducible each time.  No hernia recurrence at the umbilicus. MUSCULOSKELETAL:  Normal muscle strength and tone in all four extremities.  No peripheral edema or cyanosis. NEUROLOGIC:  Motor and sensation is grossly normal.  Cranial nerves are grossly intact. PSYCH:  Alert and oriented to person, place and time. Affect is normal.  Laboratory Analysis: No results found for this or any previous visit (from the past 24 hours).  Imaging: No results found.  Assessment and Plan: This is a 53 y.o. male with left groin pain.  - Discussed with the patient the findings on exam.  Although there was confusion about which side his hernia repair was in 2014, I confirmed looking at the office notes, operative notes, and postoperative visits, that his inguinal hernia repair was on the right side.  However the operative note mentions the possibility of a dimple or a forming early hernia in the left groin.  However this was not a full hernia at the time so no surgery was done.  I do not feel any specific bulging in the left groin during exam today.  However on the right side, he could have a possible recurrence.  He has been asymptomatic on the right side. - Discussed with patient that in order to better evaluate this area, we will obtain bilateral groin ultrasounds.  We should be able to see in more detail if there is any hernia formation on either side. - The patient will follow-up with me after ultrasounds are done so that we can go over the results and if any surgical need is present, we can also discuss surgery dates and  procedures to be done.  I briefly discussed with the patient that if he does have a recurrence at the prior hernia site on the right side, most likely we will attempt this as an open procedure.  On the left side if he does have a new hernia, we could potentially treat this via laparoscopy as no surgery was done on that side. - Follow-up after ultrasounds.  I spent 30 minutes dedicated to the care of this patient on the date of this encounter to include pre-visit review of records, face-to-face time with the patient discussing diagnosis and management, and any post-visit coordination of care.   Aloysius Sheree Plant, MD [facility]

## 2024-02-26 ENCOUNTER — Ambulatory Visit
Admission: RE | Admit: 2024-02-26 | Discharge: 2024-02-26 | Disposition: A | Discharge status: Home / Self Care | Source: Ambulatory Visit | Attending: Surgery | Admitting: Surgery

## 2024-02-26 DIAGNOSIS — K4090 Unilateral inguinal hernia, without obstruction or gangrene, not specified as recurrent: Secondary | ICD-10-CM | POA: Insufficient documentation

## 2024-02-26 DIAGNOSIS — R1032 Left lower quadrant pain: Secondary | ICD-10-CM | POA: Diagnosis not present

## 2024-02-26 DIAGNOSIS — R102 Pelvic and perineal pain: Secondary | ICD-10-CM | POA: Diagnosis not present

## 2024-02-28 ENCOUNTER — Ambulatory Visit: Admitting: Surgery

## 2024-02-28 ENCOUNTER — Encounter: Payer: Self-pay | Admitting: Surgery

## 2024-02-28 VITALS — BP 127/73 | HR 83 | Ht 72.0 in | Wt 212.0 lb

## 2024-02-28 DIAGNOSIS — K4091 Unilateral inguinal hernia, without obstruction or gangrene, recurrent: Secondary | ICD-10-CM | POA: Diagnosis not present

## 2024-02-28 DIAGNOSIS — K4090 Unilateral inguinal hernia, without obstruction or gangrene, not specified as recurrent: Secondary | ICD-10-CM

## 2024-02-28 NOTE — Progress Notes (Unsigned)
 02/28/2024  History of Present Illness: Philip Reynolds is a 53 y.o. male presenting for follow up of left groin pain.  He had a prior laparoscopic right inguinal hernia repair and open umbilical hernia repair with Dr. Dellie on 08/06/2013.  The patient thought his repair was on the left side, but I clarified with him that it was on the right.  He was last seen on 02/21/24 for this and on exam, it seemed that he may have a recurrent right inguinal hernia but could not palpate any hernia defect on the left.  He had ultrasound of bilateral groin areas on 02/26/24.  Although a cine loop was done on the left, only still images were done on the right.  On the left, there is evidence of a hernia likely containing fat, and there was no discrete evidence of a recurrent hernia on the right side.  Today, he reports his symptoms are stable since his last visit.  Denies any worsening pain.  He has been avoiding strenuous activity and the left groin has not been tender recently.  Past Medical History: Past Medical History:  Diagnosis Date   Actinic keratosis    Basal cell carcinoma 02/16/2022   R post base of neck, superfical, needs EDC   Basal cell carcinoma of back    Herpes infection 10/10/2014   Herpetic whitlow    left ring finger; Dr. Tona   Other and unspecified hyperlipidemia    Umbilical hernia without mention of obstruction or gangrene      Past Surgical History: Past Surgical History:  Procedure Laterality Date   ANTERIOR CRUCIATE LIGAMENT REPAIR (aka ACL) and meniscectomy  02/2007   Right knee (Dr. Memory)   COLONOSCOPY WITH PROPOFOL  N/A 12/22/2020   Procedure: COLONOSCOPY WITH POLYPECTOMY;  Surgeon: Janalyn Keene NOVAK, MD;  Location: [facility];  Service: Endoscopy;  Laterality: N/A;  priority 4   HERNIA REPAIR Right 08/06/13   inguinal hernia   HERNIA REPAIR  08/06/13   umbilical hernia   Herpetic Whitlow  04/1997   Left hand followed by Dr[facility] Francia at [facility]  Medications: Prior to Admission medications   Medication Sig Start Date End Date Taking? Authorizing Provider  gabapentin  (NEURONTIN ) 300 MG capsule Take 1 capsule (300 mg total) by mouth at bedtime. 12/19/22  Yes Maribeth Camellia MATSU, MD  minoxidil  (LONITEN ) 2.5 MG tablet Take 1 tablet (2.5 mg total) by mouth daily. 07/11/23  Yes Hester Alm BROCKS, MD  sildenafil  (VIAGRA ) 50 MG tablet Take 1 tablet (50 mg total) by mouth daily as needed for erectile dysfunction. 02/08/24  Yes Vincente Shivers, NP    Allergies: No Known Allergies  Review of Systems: Review of Systems  Constitutional:  Negative for chills and fever.  Respiratory:  Negative for shortness of breath.   Cardiovascular:  Negative for chest pain.  Gastrointestinal:  Negative for abdominal pain, nausea and vomiting.    Physical Exam BP 127/73   Pulse 83   Ht 6' (1.829 m)   Wt 212 lb (96.2 kg)   SpO2 98%   BMI 28.75 kg/m  CONSTITUTIONAL: No acute distress HEENT:  Normocephalic, atraumatic, extraocular motion intact. RESPIRATORY:  Normal respiratory effort without pathologic use of accessory muscles. CARDIOVASCULAR: Regular rhythm and rate. GI: The abdomen is soft, non-distended, non-tender to palpation.  After doing valsalva and coughing, I am able to feel a small left inguinal hernia which I had not been able to palpate well before.  On the right  side, I do feel that there is a subtle sliding of tissue under my fingers to suggest a recurrent right inguinal hernia.  NEUROLOGIC:  Motor and sensation is grossly normal.  Cranial nerves are grossly intact. PSYCH:  Alert and oriented to person, place and time. Affect is normal.  Labs/Imaging: Bilateral groin ultrasound on 02/26/24: FINDINGS: In the area of concern,   In bilateral inguinal region,   No solid or cystic lesion is seen.   In the left inguinal region, on the provided cine loop images, there is suggestion of hernia containing fat/omentum. However, please note  examination is limited due to technique.   In the right inguinal region, on the provided still images, no discrete hernia seen.   IMPRESSION: *Suggestion of left inguinal hernia containing fat/omentum. However, examination is limited due to technique. If clinically indicated, further evaluation with CT scan pelvis can be performed.    Assessment and Plan: This is a 53 y.o. male with new left inguinal hernia and likely recurrent right inguinal hernia.  --Discussed with the patient the findings on his ultrasound and on his exam today.  After doing more valsalva and coughing, I was able to feel a hernia on the left groin, and I still think there is likely a recurrent hernia on the right groin.  Discussed with him possible surgical options, but I think the most appropriate would be proceeding with robotic approach for the left inguinal hernia which we know is present and is symptomatic, evaluating the right groin at the same time to confirm if there's a hernia present, and if so, possibly attempt repair with robotic approach vs open approach depending on how scarred the peritoneum and mesh are on that side.  He is in agreement with this. --Discussed with him then the plan for a robotic left inguinal hernia repair, possible bilateral, and possible open right inguinal hernia repair.  Reviewed the surgery at length with him including the planned and possible incisions, the risks of bleeding, infection, injury to surrounding structures, the use of mesh for hernia repair, that this would be an outpatient surgery, post-operative activity restriction, pain control, and he's willing to proceed. --For now the patient would like to wait possibly a couple of months, as he has some trips planned.  He will look at his calendar better and will contact us  to schedule surgery.  He is aware that if more than 30 days, would need follow up visit for H&P update. --All of his questions have been answered.  I spent 30 minutes  dedicated to the care of this patient on the date of this encounter to include pre-visit review of records, face-to-face time with the patient discussing diagnosis and management, and any post-visit coordination of care.   Aloysius Sheree Plant, MD [facility]

## 2024-02-28 NOTE — Patient Instructions (Addendum)
 You may call us  to schedule you surgery. If you want to put this off for a few months we will schedule a follow up appointment for closer to that time.   You have chose to have your hernias repaired. This will be done by Dr. Desiderio at [facility]  Please see your (blue) Pre-care information that you have been given today. Our surgery scheduler will call you to verify surgery date and to go over information.   You will need to arrange to be out of work for approximately 1-2 weeks and then you may return with a lifting restriction for 4 more weeks. If you have FMLA or Disability paperwork that needs to be filled out, please have your company fax your paperwork to [phone] or you may drop this by either office. This paperwork will be filled out within 3 days after your surgery has been completed.  You may have a bruise in your groin and also swelling and brusing in your testicle area. You may use ice 4-5 times daily for 15-20 minutes each time. Make sure that you place a barrier between you and the ice pack. To decrease the swelling, you may roll up a bath towel and place it vertically in between your thighs with your testicles resting on the towel. You will want to keep this area elevated as much as possible for several days following surgery.    Inguinal Hernia, Adult Muscles help keep everything in the body in its proper place. But if a weak spot in the muscles develops, something can poke through. That is called a hernia. When this happens in the lower part of the belly (abdomen), it is called an inguinal hernia. (It takes its name from a part of the body in this region called the inguinal canal.) A weak spot in the wall of muscles lets some fat or part of the small intestine bulge through. An inguinal hernia can develop at any age. Men get them more often than women. CAUSES  In adults, an inguinal hernia develops over time. It can be triggered by: Suddenly straining the muscles of the  lower abdomen. Lifting heavy objects. Straining to have a bowel movement. Difficult bowel movements (constipation) can lead to this. Constant coughing. This may be caused by smoking or lung disease. Being overweight. Being pregnant. Working at a job that requires long periods of standing or heavy lifting. Having had an inguinal hernia before. One type can be an emergency situation. It is called a strangulated inguinal hernia. It develops if part of the small intestine slips through the weak spot and cannot get back into the abdomen. The blood supply can be cut off. If that happens, part of the intestine may die. This situation requires emergency surgery. SYMPTOMS  Often, a small inguinal hernia has no symptoms. It is found when a healthcare provider does a physical exam. Larger hernias usually have symptoms.  In adults, symptoms may include: A lump in the groin. This is easier to see when the person is standing. It might disappear when lying down. In men, a lump in the scrotum. Pain or burning in the groin. This occurs especially when lifting, straining or coughing. A dull ache or feeling of pressure in the groin. Signs of a strangulated hernia can include: A bulge in the groin that becomes very painful and tender to the touch. A bulge that turns red or purple. Fever, nausea and vomiting. Inability to have a bowel movement or to pass gas. DIAGNOSIS  To decide if you have an inguinal hernia, a healthcare provider will probably do a physical examination. This will include asking questions about any symptoms you have noticed. The healthcare provider might feel the groin area and ask you to cough. If an inguinal hernia is felt, the healthcare provider may try to slide it back into the abdomen. Usually no other tests are needed. TREATMENT  Treatments can vary. The size of the hernia makes a difference. Options include: Watchful waiting. This is often suggested if the hernia is small and you  have had no symptoms. No medical procedure will be done unless symptoms develop. You will need to watch closely for symptoms. If any occur, contact your healthcare provider right away. Surgery. This is used if the hernia is larger or you have symptoms. Open surgery. This is usually an outpatient procedure (you will not stay overnight in a hospital). An cut (incision) is made through the skin in the groin. The hernia is put back inside the abdomen. The weak area in the muscles is then repaired by herniorrhaphy or hernioplasty. Herniorrhaphy: in this type of surgery, the weak muscles are sewn back together. Hernioplasty: a patch or mesh is used to close the weak area in the abdominal wall. Laparoscopy. In this procedure, a surgeon makes small incisions. A thin tube with a tiny video camera (called a laparoscope) is put into the abdomen. The surgeon repairs the hernia with mesh by looking with the video camera and using two long instruments. HOME CARE INSTRUCTIONS  After surgery to repair an inguinal hernia: You will need to take pain medicine prescribed by your healthcare provider. Follow all directions carefully. You will need to take care of the wound from the incision. Your activity will be restricted for awhile. This will probably include no heavy lifting for several weeks. You also should not do anything too active for a few weeks. When you can return to work will depend on the type of job that you have. During watchful waiting periods, you should: Maintain a healthy weight. Eat a diet high in fiber (fruits, vegetables and whole grains). Drink plenty of fluids to avoid constipation. This means drinking enough water  and other liquids to keep your urine clear or pale yellow. Do not lift heavy objects. Do not stand for long periods of time. Quit smoking. This should keep you from developing a frequent cough. SEEK MEDICAL CARE IF:  A bulge develops in your groin area. You feel pain, a burning  sensation or pressure in the groin. This might be worse if you are lifting or straining. You develop a fever of more than 100.5 F (38.1 C). SEEK IMMEDIATE MEDICAL CARE IF:  Pain in the groin increases suddenly. A bulge in the groin gets bigger suddenly and does not go down. For men, there is sudden pain in the scrotum. Or, the size of the scrotum increases. A bulge in the groin area becomes red or purple and is painful to touch. You have nausea or vomiting that does not go away. You feel your heart beating much faster than normal. You cannot have a bowel movement or pass gas. You develop a fever of more than 102.0 F (38.9 C).   This information is not intended to replace advice given to you by your health care provider. Make sure you discuss any questions you have with your health care provider.   Document Released: 01/01/2009 Document Revised: 11/07/2011 Document Reviewed: 02/16/2015 Elsevier Interactive Patient Education [employer]

## 2024-03-06 ENCOUNTER — Telehealth: Payer: Self-pay | Admitting: Surgery

## 2024-03-06 NOTE — Telephone Encounter (Signed)
 Tried calling patient to see is ready to schedule surgery for inguinal hernia repair with Dr. Desiderio. Unable to leave message.

## 2024-03-12 NOTE — Progress Notes (Unsigned)
     Philip Secrist T. Aby Gessel, MD, CAQ Sports Medicine [facility] [address] [place], [address]  Phone: [phone]  FAX: [phone]  TAG WURTZ - 53 y.o. male  MRN [id number]  Date of Birth: 11/27/1970  Date: 03/13/2024  PCP: Vincente Shivers, NP  Referral: Vincente Shivers, NP  No chief complaint on file.  Subjective:   Philip Reynolds is a 53 y.o. very pleasant male patient with There is no height or weight on file to calculate BMI. who presents with the following:  This is a very pleasant patient who I have seen previously many years ago.  He presents with some ongoing knee pain.  Patient presents with bilateral knee pain. History is significant for right sided knee ACL reconstruction in the past.  Recent radiographs do show moderate medial and patellofemoral compartmental arthritis on the right knee.  Arthritis on the left knee is more mild.    Review of Systems is noted in the HPI, as appropriate  Objective:   There were no vitals taken for this visit.  GEN: No acute distress; alert,appropriate. PULM: Breathing comfortably in no respiratory distress PSYCH: Normally interactive.   Laboratory and Imaging Data:  Assessment and Plan:   ***

## 2024-03-13 ENCOUNTER — Ambulatory Visit: Admitting: Family Medicine

## 2024-03-13 ENCOUNTER — Encounter: Payer: Self-pay | Admitting: Family Medicine

## 2024-03-13 VITALS — BP 90/62 | HR 88 | Temp 98.3°F | Ht 71.25 in | Wt 209.2 lb

## 2024-03-13 DIAGNOSIS — M170 Bilateral primary osteoarthritis of knee: Secondary | ICD-10-CM

## 2024-03-13 DIAGNOSIS — G8929 Other chronic pain: Secondary | ICD-10-CM

## 2024-03-13 DIAGNOSIS — M1712 Unilateral primary osteoarthritis, left knee: Secondary | ICD-10-CM

## 2024-03-13 DIAGNOSIS — M7052 Other bursitis of knee, left knee: Secondary | ICD-10-CM | POA: Diagnosis not present

## 2024-03-13 DIAGNOSIS — M1711 Unilateral primary osteoarthritis, right knee: Secondary | ICD-10-CM

## 2024-03-13 DIAGNOSIS — M25561 Pain in right knee: Secondary | ICD-10-CM | POA: Diagnosis not present

## 2024-03-13 DIAGNOSIS — M25562 Pain in left knee: Secondary | ICD-10-CM

## 2024-03-13 MED ORDER — TRIAMCINOLONE ACETONIDE 40 MG/ML IJ SUSP
40.0000 mg | Freq: Once | INTRAMUSCULAR | Status: AC
Start: 2024-03-13 — End: 2024-03-13
  Administered 2024-03-13: 40 mg via INTRA_ARTICULAR

## 2024-04-02 ENCOUNTER — Telehealth: Payer: Self-pay | Admitting: Surgery

## 2024-04-02 NOTE — Telephone Encounter (Signed)
 Left another message for patient to call.  He last saw Dr. Desiderio in office for inguinal hernia on 02/28/24.  Calling to check on status for scheduling of surgery.  Patient will need another follow up in office if decides to proceed with surgery.

## 2024-04-05 ENCOUNTER — Telehealth: Payer: Self-pay | Admitting: Gastroenterology

## 2024-04-05 NOTE — Telephone Encounter (Signed)
 Good Afternoon Dr. San,     DOD of the PM    I received a call from this patient requesting to schedule for a colonoscopy due to him having a referral. Patient was last seen 3 years ago with [facility]. Patient had a colonoscopy done as well and those records can be view in EPIC. Would you please advise on how to schedule this patient.    Thank you.

## 2024-04-09 ENCOUNTER — Encounter: Payer: Self-pay | Admitting: Gastroenterology

## 2024-05-14 ENCOUNTER — Telehealth: Payer: Self-pay | Admitting: *Deleted

## 2024-05-14 DIAGNOSIS — Z131 Encounter for screening for diabetes mellitus: Secondary | ICD-10-CM

## 2024-05-14 DIAGNOSIS — Z79899 Other long term (current) drug therapy: Secondary | ICD-10-CM

## 2024-05-14 DIAGNOSIS — E785 Hyperlipidemia, unspecified: Secondary | ICD-10-CM

## 2024-05-14 DIAGNOSIS — Z125 Encounter for screening for malignant neoplasm of prostate: Secondary | ICD-10-CM

## 2024-05-14 NOTE — Telephone Encounter (Signed)
-----   Message from Veva JINNY Ferrari sent at 05/09/2024 11:32 AM EDT ----- Regarding: Lab orders for Mon, 9.22.25 Patient is scheduled for CPX labs, please order future labs, Thanks , Veva

## 2024-05-20 ENCOUNTER — Ambulatory Visit: Payer: Self-pay | Admitting: Family Medicine

## 2024-05-20 ENCOUNTER — Telehealth: Payer: Self-pay | Admitting: General Practice

## 2024-05-20 ENCOUNTER — Other Ambulatory Visit (INDEPENDENT_AMBULATORY_CARE_PROVIDER_SITE_OTHER)

## 2024-05-20 DIAGNOSIS — E785 Hyperlipidemia, unspecified: Secondary | ICD-10-CM | POA: Diagnosis not present

## 2024-05-20 DIAGNOSIS — Z79899 Other long term (current) drug therapy: Secondary | ICD-10-CM | POA: Diagnosis not present

## 2024-05-20 DIAGNOSIS — Z131 Encounter for screening for diabetes mellitus: Secondary | ICD-10-CM | POA: Diagnosis not present

## 2024-05-20 DIAGNOSIS — Z125 Encounter for screening for malignant neoplasm of prostate: Secondary | ICD-10-CM | POA: Diagnosis not present

## 2024-05-20 LAB — CBC WITH DIFFERENTIAL/PLATELET
Basophils Absolute: 0 K/uL (ref 0.0–0.1)
Basophils Relative: 0.8 % (ref 0.0–3.0)
Eosinophils Absolute: 0.1 K/uL (ref 0.0–0.7)
Eosinophils Relative: 1.6 % (ref 0.0–5.0)
HCT: 46.5 % (ref 39.0–52.0)
Hemoglobin: 15.9 g/dL (ref 13.0–17.0)
Lymphocytes Relative: 29.4 % (ref 12.0–46.0)
Lymphs Abs: 1.7 K/uL (ref 0.7–4.0)
MCHC: 34.2 g/dL (ref 30.0–36.0)
MCV: 88.9 fl (ref 78.0–100.0)
Monocytes Absolute: 0.4 K/uL (ref 0.1–1.0)
Monocytes Relative: 7.3 % (ref 3.0–12.0)
Neutro Abs: 3.5 K/uL (ref 1.4–7.7)
Neutrophils Relative %: 60.9 % (ref 43.0–77.0)
Platelets: 255 K/uL (ref 150.0–400.0)
RBC: 5.23 Mil/uL (ref 4.22–5.81)
RDW: 13.7 % (ref 11.5–15.5)
WBC: 5.7 K/uL (ref 4.0–10.5)

## 2024-05-20 LAB — LIPID PANEL
Cholesterol: 246 mg/dL — ABNORMAL HIGH (ref 0–200)
HDL: 32.5 mg/dL — ABNORMAL LOW
LDL Cholesterol: 160 mg/dL — ABNORMAL HIGH (ref 0–99)
NonHDL: 213.67
Total CHOL/HDL Ratio: 8
Triglycerides: 266 mg/dL — ABNORMAL HIGH (ref 0.0–149.0)
VLDL: 53.2 mg/dL — ABNORMAL HIGH (ref 0.0–40.0)

## 2024-05-20 LAB — BASIC METABOLIC PANEL WITH GFR
BUN: 26 mg/dL — ABNORMAL HIGH (ref 6–23)
CO2: 26 meq/L (ref 19–32)
Calcium: 9.6 mg/dL (ref 8.4–10.5)
Chloride: 105 meq/L (ref 96–112)
Creatinine, Ser: 1.03 mg/dL (ref 0.40–1.50)
GFR: 84.39 mL/min
Glucose, Bld: 115 mg/dL — ABNORMAL HIGH (ref 70–99)
Potassium: 4.6 meq/L (ref 3.5–5.1)
Sodium: 139 meq/L (ref 135–145)

## 2024-05-20 LAB — HEPATIC FUNCTION PANEL
ALT: 27 U/L (ref 0–53)
AST: 21 U/L (ref 0–37)
Albumin: 4.8 g/dL (ref 3.5–5.2)
Alkaline Phosphatase: 70 U/L (ref 39–117)
Bilirubin, Direct: 0.1 mg/dL (ref 0.0–0.3)
Total Bilirubin: 0.8 mg/dL (ref 0.2–1.2)
Total Protein: 7.5 g/dL (ref 6.0–8.3)

## 2024-05-20 LAB — HEMOGLOBIN A1C: Hgb A1c MFr Bld: 5.7 % (ref 4.6–6.5)

## 2024-05-20 NOTE — Telephone Encounter (Signed)
 Lvm to speak to patient CPE needs to be with PCP not Dr Watt.

## 2024-05-21 ENCOUNTER — Encounter: Payer: Self-pay | Admitting: Gastroenterology

## 2024-05-21 ENCOUNTER — Telehealth: Payer: Self-pay | Admitting: General Practice

## 2024-05-21 NOTE — Telephone Encounter (Signed)
 Called patient, advised we need to reschedule with Carrol Aurora.  Patient state when he saw Dr Watt in July the discussed the [facility], and was told to stop at front desk and switch providers and schedule physical.  State he had seen Dr Watt in the past and they had discussed is previous care during the visit. State he has already done the blood work for his physical as requested and did not wish to continue care with Aurora.

## 2024-05-21 NOTE — Telephone Encounter (Signed)
 OK.  That is fine.  I don't recall that, but it is fine.

## 2024-05-22 ENCOUNTER — Ambulatory Visit (AMBULATORY_SURGERY_CENTER)

## 2024-05-22 VITALS — Ht 71.0 in | Wt 210.0 lb

## 2024-05-22 DIAGNOSIS — Z860100 Personal history of colon polyps, unspecified: Secondary | ICD-10-CM

## 2024-05-22 LAB — PSA, TOTAL WITH REFLEX TO PSA, FREE: PSA, Total: 0.6 ng/mL (ref <=4.0)

## 2024-05-22 MED ORDER — NA SULFATE-K SULFATE-MG SULF 17.5-3.13-1.6 GM/177ML PO SOLN: 1.0000 | Freq: Once | ORAL | 0 refills | Status: AC

## 2024-05-22 NOTE — Progress Notes (Signed)
Pre visit completed via phone call; Patient verified name, DOB, and address; No egg or soy allergy known to patient;  No issues known to pt with past sedation with any surgeries or procedures; Patient denies ever being told they had issues or difficulty with intubation;  No FH of Malignant Hyperthermia; Pt is not on diet pills; Pt is not on home 02;  Pt is not on blood thinners;  Pt denies issues with constipation;  No A fib or A flutter; Have any cardiac testing pending--NO Insurance verified during PV appt--- BCBS Pt can ambulate without assistance;  Pt denies use of chewing tobacco Discussed diabetic/weight loss medication holds; Discussed NSAID holds; Checked BMI to be less than 50; Pt instructed to use Singlecare.com or GoodRx for a price reduction on prep  Patient's chart reviewed by Cathlyn Parsons CNRA prior to previsit and patient appropriate for the [facility].  Pre visit completed and red dot placed by patient's name on their procedure day (on provider's schedule).    Instructions sent to MyChart per patient request;

## 2024-05-26 ENCOUNTER — Encounter: Payer: Self-pay | Admitting: Family Medicine

## 2024-05-26 NOTE — Progress Notes (Signed)
 "    Philip Mcmackin T. Kortnie Stovall, MD, CAQ Sports Medicine [facility] at [facility] [address] [place], [address]  Phone: [phone]  FAX: [phone]  Philip Reynolds - 53 y.o. male  MRN [id number]  Date of Birth: 1971-06-13  Date: 05/27/2024  PCP: Watt Mirza, MD  Referral: Vincente Shivers, NP  Chief Complaint  Patient presents with   Annual Exam   Patient Care Team: Watt Mirza, MD as PCP - General (Family Medicine) Dellie Louanne MATSU, MD (General Surgery) Subjective:   Philip Reynolds is a 53 y.o. pleasant patient who presents with the following:  Discussed the use of AI scribe software for clinical note transcription with the patient, who gave verbal consent to proceed.  30 years, thought maybe herpetic whitlow History of Present Illness He has experienced recurrent herpetic lesions for approximately thirty years, often associated with sinus infections. These episodes are characterized by severe headaches, akin to migraines, and sores on the outside of his nose that resemble ulcers and sometimes bleed. The condition initially started in his hand, with pain in the ring finger and a sore that follows a path up to his chest, seemingly along a nerve path. Although he has not experienced this in about a year, he felt some pain in his ring finger this morning, with a sore developing. He associates the onset of these symptoms with stress or injury to his hand.  He has previously tried Valtrex  for these symptoms but found that ibuprofen  provided similar relief. He has not taken Valtrex  in about twenty years. He typically takes ibuprofen  at the onset of symptoms, which helps alleviate the condition within five to six days, although it can last up to two weeks. He experiences these episodes five to six times a year, particularly in the winter when he has sinus issues.  He has a history of shoulder pain due to a bulging disc in his neck, diagnosed via  MRI. He has been taking gabapentin  for the past two weeks due to a recurrence of pain after discontinuing exercises that had previously alleviated his symptoms. He had not taken gabapentin  for about six months prior to this recent episode. He engages in neck range of motion exercises and strength training, including pushups and planks, which significantly reduce his pain when performed regularly.  He consumes alcohol regularly, typically one or two vodka mixed drinks per night, four to five nights a week, sometimes up to seven nights. Each drink contains approximately two shots of vodka. He also occasionally mixes his drinks with Red Bull, which does not affect his sleep.  His recent lab results indicate elevated blood sugar and cholesterol levels. His fasting blood sugar and hemoglobin A1c are elevated, with the latter at 5.7. He does not smoke, use tobacco products, or engage in recreational drug use. He takes protein supplements and creatine to support his exercise regimen, aiming to gain muscle mass. He consumes approximately 200-250 grams of protein daily.  This is a new patient to my primary care practice.  I saw him for knee pain a few months ago.  He does have hyperlipidemia with most recent cholesterol at 246, hypertriglyceridemia, and an LDL of 160. - He does not currently take medications  Preventative Health Maintenance Visit:  Health Maintenance Summary Reviewed and updated, unless pt declines services.  Tobacco History Reviewed. Alcohol: No concerns, no excessive use - 4-5 a day Exercise Habits: Working out virtually every day STD concerns: no risk or activity to increase  risk Drug Use: None  The 10-year ASCVD risk score (Arnett DK, et al., 2019) is: 4.6%   Values used to calculate the score:     Age: 64 years     Clincally relevant sex: Male     Is Non-Hispanic African American: No     Diabetic: No     Tobacco smoker: No     Systolic Blood Pressure: 94 mmHg     Is BP  treated: No     HDL Cholesterol: 32.5 mg/dL     Total Cholesterol: 246 mg/dL   Health Maintenance  Topic Date Due   Colonoscopy  12/23/2023   Zoster Vaccines- Shingrix (1 of 2) 08/26/2024 (Originally 05/09/1992)   Influenza Vaccine  11/26/2024 (Originally 03/29/2024)   Pneumococcal Vaccine: 50+ Years (1 of 1 - PCV) 05/27/2025 (Originally 05/10/2023)   Hepatitis B Vaccines 19-59 Average Risk (1 of 3 - 19+ 3-dose series) 05/27/2025 (Originally 05/09/1992)   DTaP/Tdap/Td (4 - Td or Tdap) 02/07/2034   HPV VACCINES  Aged Out   Meningococcal B Vaccine  Aged Out   COVID-19 Vaccine  Discontinued   Hepatitis C Screening  Discontinued   HIV Screening  Discontinued   Immunization History  Administered Date(s) Administered   Influenza,inj,Quad PF,6+ Mos 07/18/2013, 09/17/2015   Td 10/24/2000, 02/08/2024   Tdap 07/18/2013   Patient Active Problem List   Diagnosis Date Noted   Hyperlipidemia 09/30/2008    Priority: Medium    Cervical radiculopathy 09/30/2022    Priority: Low   Herpesviral infection 10/10/2014    Priority: Low   Basal cell carcinoma 05/28/2009    Priority: Low   Prediabetes 05/27/2024   Erectile dysfunction 10/30/2019   Unilateral inguinal hernia without obstruction or gangrene 07/18/2013   Lipoma of skin and subcutaneous tissue 05/18/2011    Past Medical History:  Diagnosis Date   Basal cell carcinoma 02/16/2022   R post base of neck, superfical, needs EDC   Basal cell carcinoma of back    Herpes infection 10/10/2014   Mixed hyperlipidemia    Umbilical hernia without mention of obstruction or gangrene     Past Surgical History:  Procedure Laterality Date   ANTERIOR CRUCIATE LIGAMENT REPAIR (aka ACL) and meniscectomy  02/27/2007   Right knee (Dr. Memory)   COLONOSCOPY WITH PROPOFOL  N/A 12/22/2020   Procedure: COLONOSCOPY WITH POLYPECTOMY;  Surgeon: Janalyn Keene NOVAK, MD;  [facility];  Service: Endoscopy;  Laterality: N/A;  priority 4    INGUINAL HERNIA REPAIR Right 08/06/2013   UMBILICAL HERNIA REPAIR  08/06/2013    Family History  Problem Relation Age of Onset   Diabetes Father    Stroke Father        Post radiation for brain tumor   Cancer Father        Brain tumor   Depression Father    Kidney disease Neg Hx    Alcohol abuse Neg Hx    Drug abuse Neg Hx    CAD Neg Hx    Colon polyps Neg Hx    Colon cancer Neg Hx     Social History   Social History Narrative   Lives with wife Alben Jepsen   Occ: chief of staff business.   Activity: regular gym 5d /wk   Diet: tries to eat healthy, avoids fast food, good water , fruits/vegetables daily    Past Medical History, Surgical History, Social History, Family History, Problem List, Medications, and Allergies have been reviewed and updated if relevant.  Review of Systems:  Pertinent positives are listed above.  Otherwise, a full 14 point review of systems has been done in full and it is negative except where it is noted positive.  Objective:   BP 94/60   Pulse 92   Temp 98.2 F (36.8 C) (Temporal)   Ht 5' 11.25 (1.81 m)   Wt 213 lb 8 oz (96.8 kg)   SpO2 96%   BMI 29.57 kg/m  Ideal Body Weight: Weight in (lb) to have BMI = 25: 180.1  Ideal Body Weight: Weight in (lb) to have BMI = 25: 180.1 No results found.    05/27/2024   10:31 AM 02/08/2024    9:18 AM 05/24/2023    9:07 AM 09/30/2022    8:39 AM 05/18/2022    8:34 AM  Depression screen PHQ 2/9  Decreased Interest 0 0 0 0 0  Down, Depressed, Hopeless 0  0 0 0  PHQ - 2 Score 0 0 0 0 0  Altered sleeping   2    Tired, decreased energy   1    Change in appetite   0    Feeling bad or failure about yourself    0    Trouble concentrating   0    Moving slowly or fidgety/restless   0    Suicidal thoughts   0    PHQ-9 Score   3    Difficult doing work/chores   Not difficult at all       GEN: well developed, well nourished, no acute distress Eyes: conjunctiva and lids normal, PERRLA, EOMI ENT: TM  clear, nares clear, oral exam WNL Neck: supple, no lymphadenopathy, no thyromegaly, no JVD Pulm: clear to auscultation and percussion, respiratory effort normal CV: regular rate and rhythm, S1-S2, no murmur, rub or gallop, no bruits, peripheral pulses normal and symmetric, no cyanosis, clubbing, edema or varicosities GI: soft, non-tender; no hepatosplenomegaly, masses; active bowel sounds all quadrants GU: deferred Lymph: no cervical, axillary or inguinal adenopathy MSK: gait normal, muscle tone and strength WNL, no joint swelling, effusions, discoloration, crepitus  SKIN: clear, good turgor, color WNL, no rashes, lesions, or ulcerations Neuro: normal mental status, normal strength, sensation, and motion Psych: alert; oriented to person, place and time, normally interactive and not anxious or depressed in appearance.  All labs reviewed with patient. Results for orders placed or performed in visit on 05/20/24  PSA, Total with Reflex to PSA, Free   Collection Time: 05/20/24  9:30 AM  Result Value Ref Range   PSA, Total 0.6 < OR = 4.0 ng/mL  Hepatic Function Panel   Collection Time: 05/20/24  9:30 AM  Result Value Ref Range   Total Bilirubin 0.8 0.2 - 1.2 mg/dL   Bilirubin, Direct 0.1 0.0 - 0.3 mg/dL   Alkaline Phosphatase 70 39 - 117 U/L   AST 21 0 - 37 U/L   ALT 27 0 - 53 U/L   Total Protein 7.5 6.0 - 8.3 g/dL   Albumin 4.8 3.5 - 5.2 g/dL  Basic metabolic panel with GFR   Collection Time: 05/20/24  9:30 AM  Result Value Ref Range   Sodium 139 135 - 145 mEq/L   Potassium 4.6 3.5 - 5.1 mEq/L   Chloride 105 96 - 112 mEq/L   CO2 26 19 - 32 mEq/L   Glucose, Bld 115 (H) 70 - 99 mg/dL   BUN 26 (H) 6 - 23 mg/dL   Creatinine, Ser 8.96 0.40 - 1.50 mg/dL   GFR 15.60 >39.99 mL/min  Calcium  9.6 8.4 - 10.5 mg/dL  CBC with Differential/Platelet   Collection Time: 05/20/24  9:30 AM  Result Value Ref Range   WBC 5.7 4.0 - 10.5 K/uL   RBC 5.23 4.22 - 5.81 Mil/uL   Hemoglobin 15.9 13.0 -  17.0 g/dL   HCT 53.4 60.9 - 47.9 %   MCV 88.9 78.0 - 100.0 fl   MCHC 34.2 30.0 - 36.0 g/dL   RDW 86.2 88.4 - 84.4 %   Platelets 255.0 150.0 - 400.0 K/uL   Neutrophils Relative % 60.9 43.0 - 77.0 %   Lymphocytes Relative 29.4 12.0 - 46.0 %   Monocytes Relative 7.3 3.0 - 12.0 %   Eosinophils Relative 1.6 0.0 - 5.0 %   Basophils Relative 0.8 0.0 - 3.0 %   Neutro Abs 3.5 1.4 - 7.7 K/uL   Lymphs Abs 1.7 0.7 - 4.0 K/uL   Monocytes Absolute 0.4 0.1 - 1.0 K/uL   Eosinophils Absolute 0.1 0.0 - 0.7 K/uL   Basophils Absolute 0.0 0.0 - 0.1 K/uL  Lipid panel   Collection Time: 05/20/24  9:30 AM  Result Value Ref Range   Cholesterol 246 (H) 0 - 200 mg/dL   Triglycerides 733.9 (H) 0.0 - 149.0 mg/dL   HDL 67.49 (L) >60.99 mg/dL   VLDL 46.7 (H) 0.0 - 59.9 mg/dL   LDL Cholesterol 839 (H) 0 - 99 mg/dL   Total CHOL/HDL Ratio 8    NonHDL 213.67   Hemoglobin A1c   Collection Time: 05/20/24  9:30 AM  Result Value Ref Range   Hgb A1c MFr Bld 5.7 4.6 - 6.5 %    Assessment and Plan:     ICD-10-CM   1. Healthcare maintenance  Z00.00     2. Vasculogenic erectile dysfunction, unspecified vasculogenic erectile dysfunction type  N52.9 sildenafil  (VIAGRA ) 100 MG tablet    3. Prediabetes  R73.03      Routine health maintenance, he is basically pretty healthy in general.  He wants to hold off on any vaccination right now. Assessment & Plan Recurrent herpes simplex virus infection (non-genital) Recurrent herpes simplex virus infection with painful sores on the nose and hand, associated with headaches. Symptoms often triggered by stress or injury. Previous Valtrex  use was ineffective, but ibuprofen  provides relief. Differential diagnosis favors herpes simplex virus over herpes zoster due to recurrence pattern. - Prescribe Valtrex  2 tablets, repeat in 12 hours if symptoms flare up. - Advise continued use of ibuprofen  for symptom relief as needed.  Cervical radiculopathy due to bulging disc Cervical  radiculopathy due to bulging disc, previously managed with exercises. Symptoms improved with pushups and planks. Gabapentin  used intermittently for shoulder pain. - Continue neck range of motion exercises and strength training, including pushups and planks. - Continue gabapentin  as needed for pain management.  alcohol use Consuming 4-5 shots of alcohol per night, 5-7 days a week. Discussed potential health risks including liver impact and increased cancer risk. - Advise reducing alcohol intake to lower health risks. - Encourage finding alternative stress-relief activities to replace alcohol consumption.  Pre-diabetes Elevated fasting blood sugar and hemoglobin A1c at 5.7, indicating pre-diabetes. Discussed dietary modifications and reducing alcohol intake to manage blood sugar levels. - Advise dietary modifications to reduce junk food and increase fruits and vegetables. - Recommend reducing alcohol intake to decrease risk of diabetes progression.  Hyperlipidemia Cholesterol levels elevated, but overall 10-year risk of cardiovascular events is low at 4%. Discussed lifestyle modifications to manage cholesterol levels. - Encourage cardiovascular exercise to  help manage cholesterol levels.  Health Maintenance Exam: The patient's preventative maintenance and recommended screening tests for an annual wellness exam were reviewed in full today. Brought up to date unless services declined.  Counselled on the importance of diet, exercise, and its role in overall health and mortality. The patient's FH and SH was reviewed, including their home life, tobacco status, and drug and alcohol status.  Follow-up in 1 year for physical exam or additional follow-up below.  Disposition: No follow-ups on file.  Meds ordered this encounter  Medications   valACYclovir  (VALTREX ) 1000 MG tablet    Sig: Take 2 tablets (2,000 mg total) by mouth 2 (two) times daily. For 24 hours for outbreak    Dispense:  30 tablet     Refill:  1   sildenafil  (VIAGRA ) 100 MG tablet    Sig: Take 0.5-1 tablets (50-100 mg total) by mouth daily as needed for erectile dysfunction.    Dispense:  90 tablet    Refill:  3   Medications Discontinued During This Encounter  Medication Reason   LATISSE 0.03 % ophthalmic solution    sildenafil  (VIAGRA ) 50 MG tablet    No orders of the defined types were placed in this encounter.   Signed,  Jacques DASEN. Cenia Zaragosa, MD   Allergies as of 05/27/2024   No Known Allergies      Medication List        Accurate as of May 27, 2024  1:29 PM. If you have any questions, ask your nurse or doctor.          STOP taking these medications    Latisse 0.03 % ophthalmic solution Generic drug: bimatoprost Stopped by: Jacques Jaiya Mooradian       TAKE these medications    gabapentin  300 MG capsule Commonly known as: NEURONTIN  Take 1 capsule (300 mg total) by mouth at bedtime.   minoxidil  2.5 MG tablet Commonly known as: LONITEN  Take 1 tablet (2.5 mg total) by mouth daily.   sildenafil  100 MG tablet Commonly known as: Viagra  Take 0.5-1 tablets (50-100 mg total) by mouth daily as needed for erectile dysfunction. What changed:  medication strength how much to take Changed by: Jacques Mahin Guardia   valACYclovir  1000 MG tablet Commonly known as: VALTREX  Take 2 tablets (2,000 mg total) by mouth 2 (two) times daily. For 24 hours for outbreak Started by: Jacques Ruperto Kiernan       "

## 2024-05-27 ENCOUNTER — Encounter: Payer: Self-pay | Admitting: Family Medicine

## 2024-05-27 ENCOUNTER — Ambulatory Visit (INDEPENDENT_AMBULATORY_CARE_PROVIDER_SITE_OTHER): Admitting: Family Medicine

## 2024-05-27 VITALS — BP 94/60 | HR 92 | Temp 98.2°F | Ht 71.25 in | Wt 213.5 lb

## 2024-05-27 DIAGNOSIS — R7303 Prediabetes: Secondary | ICD-10-CM | POA: Diagnosis not present

## 2024-05-27 DIAGNOSIS — N529 Male erectile dysfunction, unspecified: Secondary | ICD-10-CM

## 2024-05-27 DIAGNOSIS — Z0000 Encounter for general adult medical examination without abnormal findings: Secondary | ICD-10-CM | POA: Diagnosis not present

## 2024-05-27 MED ORDER — SILDENAFIL CITRATE 100 MG PO TABS: 50.0000 mg | ORAL_TABLET | Freq: Every day | ORAL | 3 refills | Status: AC | PRN

## 2024-05-27 MED ORDER — VALACYCLOVIR HCL 1 G PO TABS: 2000.0000 mg | ORAL_TABLET | Freq: Two times a day (BID) | ORAL | 1 refills | Status: AC

## 2024-05-28 ENCOUNTER — Encounter: Admitting: General Practice

## 2024-05-29 ENCOUNTER — Encounter: Payer: BC Managed Care – PPO | Admitting: Family Medicine

## 2024-06-05 ENCOUNTER — Encounter: Admitting: Gastroenterology

## 2024-06-18 ENCOUNTER — Ambulatory Visit: Admitting: Dermatology

## 2024-06-18 ENCOUNTER — Encounter: Payer: Self-pay | Admitting: Dermatology

## 2024-06-18 DIAGNOSIS — L578 Other skin changes due to chronic exposure to nonionizing radiation: Secondary | ICD-10-CM | POA: Diagnosis not present

## 2024-06-18 DIAGNOSIS — L570 Actinic keratosis: Secondary | ICD-10-CM | POA: Diagnosis not present

## 2024-06-18 DIAGNOSIS — D1801 Hemangioma of skin and subcutaneous tissue: Secondary | ICD-10-CM

## 2024-06-18 DIAGNOSIS — L814 Other melanin hyperpigmentation: Secondary | ICD-10-CM

## 2024-06-18 DIAGNOSIS — L821 Other seborrheic keratosis: Secondary | ICD-10-CM | POA: Diagnosis not present

## 2024-06-18 DIAGNOSIS — Z1283 Encounter for screening for malignant neoplasm of skin: Secondary | ICD-10-CM

## 2024-06-18 DIAGNOSIS — D229 Melanocytic nevi, unspecified: Secondary | ICD-10-CM

## 2024-06-18 DIAGNOSIS — W908XXA Exposure to other nonionizing radiation, initial encounter: Secondary | ICD-10-CM

## 2024-06-18 DIAGNOSIS — L649 Androgenic alopecia, unspecified: Secondary | ICD-10-CM

## 2024-06-18 DIAGNOSIS — Z79899 Other long term (current) drug therapy: Secondary | ICD-10-CM

## 2024-06-18 DIAGNOSIS — Z7189 Other specified counseling: Secondary | ICD-10-CM

## 2024-06-18 DIAGNOSIS — Z85828 Personal history of other malignant neoplasm of skin: Secondary | ICD-10-CM

## 2024-06-18 MED ORDER — MINOXIDIL 2.5 MG PO TABS: 2.5000 mg | ORAL_TABLET | Freq: Every day | ORAL | 3 refills | Status: AC

## 2024-06-18 NOTE — Progress Notes (Signed)
 Follow-Up Visit   Subjective  Philip Reynolds is a 53 y.o. male who presents for the following: Skin Cancer Screening and Full Body Skin Exam, hx of BCC, Aks, Androgenetic Alopecia pt ran out of Minoxidil  2.5mg  ~2-19m ago, pt not sure if any improvement  The patient presents for Total-Body Skin Exam (TBSE) for skin cancer screening and mole check. The patient has spots, moles and lesions to be evaluated, some may be new or changing and the patient may have concern these could be cancer.  The following portions of the chart were reviewed this encounter and updated as appropriate: medications, allergies, medical history  Review of Systems:  No other skin or systemic complaints except as noted in HPI or Assessment and Plan.  Objective  Well appearing patient in no apparent distress; mood and affect are within normal limits.  A full examination was performed including scalp, head, eyes, ears, nose, lips, neck, chest, axillae, abdomen, back, buttocks, bilateral upper extremities, bilateral lower extremities, hands, feet, fingers, toes, fingernails, and toenails. All findings within normal limits unless otherwise noted below.   Relevant physical exam findings are noted in the Assessment and Plan.  face x 4 (4) Pink scaly macules  Scalp photos            Assessment & Plan   SKIN CANCER SCREENING PERFORMED TODAY.  ACTINIC DAMAGE - Chronic condition, secondary to cumulative UV/sun exposure - diffuse scaly erythematous macules with underlying dyspigmentation - Recommend daily broad spectrum sunscreen SPF 30+ to sun-exposed areas, reapply every 2 hours as needed.  - Staying in the shade or wearing long sleeves, sun glasses (UVA+UVB protection) and wide brim hats (4-inch brim around the entire circumference of the hat) are also recommended for sun protection.  - Call for new or changing lesions.  LENTIGINES, SEBORRHEIC KERATOSES, HEMANGIOMAS - Benign normal skin lesions -  Benign-appearing - Call for any changes  MELANOCYTIC NEVI - Tan-brown and/or pink-flesh-colored symmetric macules and papules - Benign appearing on exam today - Observation - Call clinic for new or changing moles - Recommend daily use of broad spectrum spf 30+ sunscreen to sun-exposed areas.   HISTORY OF BASAL CELL CARCINOMA OF THE SKIN - No evidence of recurrence today - Recommend regular full body skin exams - Recommend daily broad spectrum sunscreen SPF 30+ to sun-exposed areas, reapply every 2 hours as needed.  - Call if any new or changing lesions are noted between office visits  - R post base of neck, R back  ANDROGENETIC ALOPECIA (MALE PATTERN HAIR LOSS) scalp Exam: Frontal scalp thinning with intact frontal hairline and miniaturization  Chronic and persistent condition with duration or expected duration over one year. Condition is improving with treatment compared to previous photos, but not currently at goal. Androgenetic Alopecia (or Male pattern hair loss) refers to the common patterned hair loss affecting many men.  Male pattern alopecia is mediated by dihydrotestosterone which induces miniaturization of androgen-sensitive hair follicles.  It is chronic and persistent, but treatable; not curable. Topical treatment includes: - 5% topical Minoxidil  Oral treatment includes: - Finasteride 1 mg qd - Minoxidil  1.25 - 5 mg qd - Dutasteride 0.5 mg qd Adjunct therapy includes: - Low Level Laser Light Therapy (LLLT) - Platelet-rich Plasma injections (PRP) - Hair Transplantation or scalp reduction Treatment Plan: Restart Minoxidil  2.5mg  1 po qd  Doses of oral minoxidil  for hair loss are considered 'low dose'. This is because the doses used for hair loss are much lower than the doses  which are used for conditions such as high blood pressure (hypertension). The doses used for hypertension are 10-40mg  per day.  Side effects are uncommon at the low doses (up to 2.5 mg/day) used to  treat hair loss. Potential side effects, more commonly seen at higher doses, include: Increase in hair growth (hypertrichosis) elsewhere on face and body Temporary hair shedding upon starting medication which may last up to 4 weeks Ankle swelling, fluid retention, rapid weight gain more than 5 pounds Low blood pressure and feeling lightheaded or dizzy when standing up quickly Fast or irregular heartbeat Headaches  Long term medication management.  Patient is using long term (months to years) prescription medication  to control their dermatologic condition.  These medications require periodic monitoring to evaluate for efficacy and side effects and may require periodic laboratory monitoring.    AK (ACTINIC KERATOSIS) (4) face x 4 (4) Actinic keratoses are precancerous spots that appear secondary to cumulative UV radiation exposure/sun exposure over time. They are chronic with expected duration over 1 year. A portion of actinic keratoses will progress to squamous cell carcinoma of the skin. It is not possible to reliably predict which spots will progress to skin cancer and so treatment is recommended to prevent development of skin cancer.  Recommend daily broad spectrum sunscreen SPF 30+ to sun-exposed areas, reapply every 2 hours as needed.  Recommend staying in the shade or wearing long sleeves, sun glasses (UVA+UVB protection) and wide brim hats (4-inch brim around the entire circumference of the hat). Call for new or changing lesions. Destruction of lesion - face x 4 (4) Complexity: simple   Destruction method: cryotherapy   Informed consent: discussed and consent obtained   Timeout:  patient name, date of birth, surgical site, and procedure verified Lesion destroyed using liquid nitrogen: Yes   Region frozen until ice ball extended beyond lesion: Yes   Outcome: patient tolerated procedure well with no complications   Post-procedure details: wound care instructions given    Return in  about 1 year (around 06/18/2025) for TBSE, Hx of BCC, Hx of AKs.  I, Grayce Saunas, RMA, am acting as scribe for Alm Rhyme, MD .   Documentation: I have reviewed the above documentation for accuracy and completeness, and I agree with the above.  Alm Rhyme, MD

## 2024-06-18 NOTE — Patient Instructions (Signed)

## 2024-06-25 ENCOUNTER — Encounter: Payer: Self-pay | Admitting: Gastroenterology

## 2024-06-25 ENCOUNTER — Telehealth: Payer: Self-pay | Admitting: Surgery

## 2024-06-25 NOTE — Telephone Encounter (Signed)
 Patient has been advised of Pre-Admission date/time, and Surgery date at [facility].  Surgery Date: 07/17/24 Preadmission Testing Date: Pending, preadmissions to call patient for appointment.    Patient informed of the scheduling process and surgery information given at time of office visit.   Patient has been made aware to call [phone], between 1-3:00pm the day before surgery, to find out what time to arrive for surgery.

## 2024-06-28 ENCOUNTER — Encounter: Payer: Self-pay | Admitting: Gastroenterology

## 2024-07-04 ENCOUNTER — Encounter: Payer: Self-pay | Admitting: Gastroenterology

## 2024-07-04 ENCOUNTER — Ambulatory Visit: Admitting: Gastroenterology

## 2024-07-04 VITALS — BP 110/67 | HR 67 | Temp 98.0°F | Resp 15 | Ht 71.25 in | Wt 210.0 lb

## 2024-07-04 DIAGNOSIS — D123 Benign neoplasm of transverse colon: Secondary | ICD-10-CM

## 2024-07-04 DIAGNOSIS — Z860101 Personal history of adenomatous and serrated colon polyps: Secondary | ICD-10-CM | POA: Diagnosis not present

## 2024-07-04 DIAGNOSIS — Z860100 Personal history of colon polyps, unspecified: Secondary | ICD-10-CM

## 2024-07-04 DIAGNOSIS — Z1211 Encounter for screening for malignant neoplasm of colon: Secondary | ICD-10-CM

## 2024-07-04 MED ORDER — SODIUM CHLORIDE 0.9 % IV SOLN: 500.0000 mL | Freq: Once | INTRAVENOUS | Status: DC

## 2024-07-04 NOTE — Patient Instructions (Addendum)
 Handouts given: Colon Polyps Resume previous diet. Continue present medications.  Await pathology results. Repeat colonoscopy for surveillance based on pathology results. Return to GI office PRN  YOU HAD AN ENDOSCOPIC PROCEDURE TODAY AT THE [facility]:   Refer to the procedure report that was given to you for any specific questions about what was found during the examination.  If the procedure report does not answer your questions, please call your gastroenterologist to clarify.  If you requested that your care partner not be given the details of your procedure findings, then the procedure report has been included in a sealed envelope for you to review at your convenience later.  YOU SHOULD EXPECT: Some feelings of bloating in the abdomen. Passage of more gas than usual.  Walking can help get rid of the air that was put into your GI tract during the procedure and reduce the bloating. If you had a lower endoscopy (such as a colonoscopy or flexible sigmoidoscopy) you may notice spotting of blood in your stool or on the toilet paper. If you underwent a bowel prep for your procedure, you may not have a normal bowel movement for a few days.  Please Note:  You might notice some irritation and congestion in your nose or some drainage.  This is from the oxygen used during your procedure.  There is no need for concern and it should clear up in a day or so.  SYMPTOMS TO REPORT IMMEDIATELY:  Following lower endoscopy (colonoscopy or flexible sigmoidoscopy):  Excessive amounts of blood in the stool  Significant tenderness or worsening of abdominal pains  Swelling of the abdomen that is new, acute  Fever of 100F or higher   For urgent or emergent issues, a gastroenterologist can be reached at any hour by calling [phone]. Do not use MyChart messaging for urgent concerns.    DIET:  We do recommend a small meal at first, but then you may proceed to your regular diet.  Drink plenty of  fluids but you should avoid alcoholic beverages for 24 hours.  ACTIVITY:  You should plan to take it easy for the rest of today and you should NOT DRIVE or use heavy machinery until tomorrow (because of the sedation medicines used during the test).    FOLLOW UP: Our staff will call the number listed on your records the next business day following your procedure.  We will call around 7:15- 8:00 am to check on you and address any questions or concerns that you may have regarding the information given to you following your procedure. If we do not reach you, we will leave a message.     If any biopsies were taken you will be contacted by phone or by letter within the next 1-3 weeks.  Please call us  at [phone] if you have not heard about the biopsies in 3 weeks.    SIGNATURES/CONFIDENTIALITY: You and/or your care partner have signed paperwork which will be entered into your electronic medical record.  These signatures attest to the fact that that the information above on your After Visit Summary has been reviewed and is understood.  Full responsibility of the confidentiality of this discharge information lies with you and/or your care-partner.

## 2024-07-04 NOTE — Progress Notes (Signed)
 GASTROENTEROLOGY PROCEDURE H&P NOTE   Primary Care Physician: Watt Mirza, MD    Reason for Procedure:  Colon polyp surveillance  Plan:    Colonoscopy  Patient is appropriate for endoscopic procedure(s) in the ambulatory ([facility]) setting.  The nature of the procedure, as well as the risks, benefits, and alternatives were carefully and thoroughly reviewed with the patient. Ample time for discussion and questions allowed. The patient understood, was satisfied, and agreed to proceed. I personally addressed all patient questions and concerns.     HPI: Philip Reynolds is a 53 y.o. male who presents for colonoscopy for ongoing colon polyp surveillance and colon cancer screening.  No active GI symptoms.  No known family history of colon cancer or related malignancy.  Patient is otherwise without complaints or active issues today.  Last colonoscopy was 11/2020 by Dr. Janalyn and notable for 2 small flat ascending colon/cecal polyps, 5 mm transverse colon polyp, and internal hemorrhoids.  Path showing adenoma x 1 and SSP x 2 and was recommended to repeat in 3 years.  Past Medical History:  Diagnosis Date   Basal cell carcinoma 02/16/2022   R post base of neck, superfical, needs EDC   Basal cell carcinoma of back    R back txted years ago   Herpes infection 10/10/2014   Mixed hyperlipidemia    Umbilical hernia without mention of obstruction or gangrene     Past Surgical History:  Procedure Laterality Date   ANTERIOR CRUCIATE LIGAMENT REPAIR (aka ACL) and meniscectomy  02/27/2007   Right knee (Dr. Memory)   COLONOSCOPY WITH PROPOFOL  N/A 12/22/2020   Procedure: COLONOSCOPY WITH POLYPECTOMY;  Surgeon: Janalyn Keene NOVAK, MD;  Location: [facility];  Service: Endoscopy;  Laterality: N/A;  priority 4   INGUINAL HERNIA REPAIR Right 08/06/2013   UMBILICAL HERNIA REPAIR  08/06/2013    Prior to Admission medications   Medication Sig Start Date End Date Taking? Authorizing  Provider  minoxidil  (LONITEN ) 2.5 MG tablet Take 1 tablet (2.5 mg total) by mouth daily. 06/18/24  Yes Hester Alm BROCKS, MD  gabapentin  (NEURONTIN ) 300 MG capsule Take 1 capsule (300 mg total) by mouth at bedtime. Patient not taking: Reported on 07/04/2024 12/19/22   Maribeth Camellia MATSU, MD  sildenafil  (VIAGRA ) 100 MG tablet Take 0.5-1 tablets (50-100 mg total) by mouth daily as needed for erectile dysfunction. 05/27/24   Copland, Mirza, MD  valACYclovir (VALTREX) 1000 MG tablet Take 2 tablets (2,000 mg total) by mouth 2 (two) times daily. For 24 hours for outbreak Patient taking differently: Take 2,000 mg by mouth 2 (two) times daily as needed (outbreaks). 05/27/24   Watt Mirza, MD    Current Outpatient Medications  Medication Sig Dispense Refill   minoxidil  (LONITEN ) 2.5 MG tablet Take 1 tablet (2.5 mg total) by mouth daily. 90 tablet 3   gabapentin  (NEURONTIN ) 300 MG capsule Take 1 capsule (300 mg total) by mouth at bedtime. (Patient not taking: Reported on 07/04/2024) 30 capsule 3   sildenafil  (VIAGRA ) 100 MG tablet Take 0.5-1 tablets (50-100 mg total) by mouth daily as needed for erectile dysfunction. 90 tablet 3   valACYclovir (VALTREX) 1000 MG tablet Take 2 tablets (2,000 mg total) by mouth 2 (two) times daily. For 24 hours for outbreak (Patient taking differently: Take 2,000 mg by mouth 2 (two) times daily as needed (outbreaks).) 30 tablet 1   Current Facility-Administered Medications  Medication Dose Route Frequency Provider Last Rate Last Admin   0.9 %  sodium chloride   infusion  500 mL Intravenous Once Anselmo Reihl V, DO        Allergies as of 07/04/2024   (No Known Allergies)    Family History  Problem Relation Age of Onset   Diabetes Father    Stroke Father        Post radiation for brain tumor   Cancer Father        Brain tumor   Depression Father    Kidney disease Neg Hx    Alcohol abuse Neg Hx    Drug abuse Neg Hx    CAD Neg Hx    Colon polyps Neg Hx     Colon cancer Neg Hx     Social History   Socioeconomic History   Marital status: Divorced    Spouse name: Not on file   Number of children: 0   Years of education: Not on file   Highest education level: Not on file  Occupational History   Occupation: Self owned lobbyist business    Comment: 04/2005  Tobacco Use   Smoking status: Never    Passive exposure: Never   Smokeless tobacco: Never  Vaping Use   Vaping status: Never Used  Substance and Sexual Activity   Alcohol use: Yes    Alcohol/week: 7.0 standard drinks of alcohol    Types: 7 Standard drinks or equivalent per week    Comment: Occasional   Drug use: No   Sexual activity: Not on file  Other Topics Concern   Not on file  Social History Narrative   Lives with wife Londyn Wotton   Occ: chief of staff business.   Activity: regular gym 5d /wk   Diet: tries to eat healthy, avoids fast food, good water , fruits/vegetables daily   Social Drivers of Health   Financial Resource Strain: Not on file  Food Insecurity: Not on file  Transportation Needs: Not on file  Physical Activity: Not on file  Stress: Not on file  Social Connections: Not on file  Intimate Partner Violence: Not on file    Physical Exam: Vital signs in last 24 hours: @BP  117/69   Pulse 70   Temp 98 F (36.7 C) (Skin)   Ht 5' 11.25 (1.81 m)   Wt 210 lb (95.3 kg)   SpO2 96%   BMI 29.08 kg/m  GEN: NAD EYE: Sclerae anicteric ENT: MMM CV: Non-tachycardic Pulm: CTA b/l GI: Soft, NT/ND NEURO:  Alert & Oriented x 3   Sandor Flatter, DO [facility]   07/04/2024 7:53 AM

## 2024-07-04 NOTE — Op Note (Signed)
 [facility] Patient Name: Philip Reynolds Procedure Date: 07/04/2024 7:55 AM MRN: [id number] Endoscopist: Sandor Flatter , MD, [id number] Age: 53 Referring MD:  Date of Birth: [birth year] Gender: Male Account #: [id number] Procedure:                Colonoscopy Indications:              Surveillance: Personal history of adenomatous and                            sessile serrated polyps on last colonoscopy 3 years                            ago                           Last colonoscopy was 11/2020 by Dr. Janalyn and                            notable for 2 small flat ascending colon/cecal                            polyps, 5 mm transverse colon polyp, and internal                            hemorrhoids. Path showing adenoma x 1 and SSP x 2                            and was recommended to repeat in 3 years. Medicines:                Monitored Anesthesia Care Procedure:                Pre-Anesthesia Assessment:                           - Prior to the procedure, a History and Physical                            was performed, and patient medications and                            allergies were reviewed. The patient's tolerance of                            previous anesthesia was also reviewed. The risks                            and benefits of the procedure and the sedation                            options and risks were discussed with the patient.                            All questions were answered, and informed consent  was obtained. Prior Anticoagulants: The patient has                            taken no anticoagulant or antiplatelet agents. ASA                            Grade Assessment: II - A patient with mild systemic                            disease. After reviewing the risks and benefits,                            the patient was deemed in satisfactory condition to                            undergo the procedure.                            After obtaining informed consent, the colonoscope                            was passed under direct vision. Throughout the                            procedure, the patient's blood pressure, pulse, and                            oxygen saturations were monitored continuously. The                            CF [id number] #[id number] was introduced through the anus                            and advanced to the the cecum, identified by                            appendiceal orifice and ileocecal valve. The                            colonoscopy was performed without difficulty. The                            patient tolerated the procedure well. The quality                            of the bowel preparation was good. The ileocecal                            valve, appendiceal orifice, and rectum were                            photographed. Scope In: 8:01:53 AM Scope Out: 8:42:23 AM Scope Withdrawal Time: 0 hours 36 minutes 42 seconds  Total Procedure Duration: 0 hours 40  minutes 30 seconds  Findings:                 The perianal and digital rectal examinations were                            normal.                           Two sessile polyps were found in the transverse                            colon. The polyps were 3 to 6 mm in size. These                            polyps were removed with a cold snare. Resection                            and retrieval were complete. Estimated blood loss                            was minimal.                           The exam was otherwise normal throughout the                            remainder of the colon.                           The retroflexed view of the distal rectum and anal                            verge was normal and showed no anal or rectal                            abnormalities. Complications:            No immediate complications. Estimated Blood Loss:     Estimated blood loss was minimal. Impression:                - Two 3 to 6 mm polyps in the transverse colon,                            removed with a cold snare. Resected and retrieved.                           - The distal rectum and anal verge are normal on                            retroflexion view. Recommendation:           - Patient has a contact number available for                            emergencies. The signs and symptoms of potential  delayed complications were discussed with the                            patient. Return to normal activities tomorrow.                            Written discharge instructions were provided to the                            patient.                           - Resume previous diet.                           - Continue present medications.                           - Await pathology results.                           - Repeat colonoscopy for surveillance based on                            pathology results.                           - Return to GI office PRN. Sandor Flatter, MD 07/04/2024 8:51:17 AM

## 2024-07-04 NOTE — Progress Notes (Signed)
Pt. states no medical or surgical changes since previsit or office visit. 

## 2024-07-04 NOTE — Progress Notes (Signed)
 Transferred to PACU via stretcher.  Not responding to stimulation at this time.  VSS upon leaving procedure room.

## 2024-07-04 NOTE — Progress Notes (Signed)
 Called to room to assist during endoscopic procedure.  Patient ID and intended procedure confirmed with present staff. Received instructions for my participation in the procedure from the performing physician.

## 2024-07-05 ENCOUNTER — Telehealth: Payer: Self-pay

## 2024-07-05 NOTE — Telephone Encounter (Signed)
  Follow up Call-     07/04/2024    7:15 AM  Call back number  Post procedure Call Back phone  # [phone]  Permission to leave phone message Yes     Follow up call, LVM

## 2024-07-08 ENCOUNTER — Other Ambulatory Visit: Payer: Self-pay

## 2024-07-08 ENCOUNTER — Ambulatory Visit: Payer: Self-pay | Admitting: Gastroenterology

## 2024-07-08 ENCOUNTER — Encounter
Admission: RE | Admit: 2024-07-08 | Discharge: 2024-07-08 | Disposition: A | Discharge status: Home / Self Care | Source: Ambulatory Visit | Attending: Surgery | Admitting: Surgery

## 2024-07-08 HISTORY — DX: Unilateral inguinal hernia, without obstruction or gangrene, not specified as recurrent: K40.90

## 2024-07-08 HISTORY — DX: Nonscarring hair loss, unspecified: L65.9

## 2024-07-08 HISTORY — DX: Gastro-esophageal reflux disease without esophagitis: K21.9

## 2024-07-08 HISTORY — DX: Male erectile dysfunction, unspecified: N52.9

## 2024-07-08 HISTORY — DX: Prediabetes: R73.03

## 2024-07-08 LAB — SURGICAL PATHOLOGY

## 2024-07-08 NOTE — Patient Instructions (Signed)
 Your procedure is scheduled on:07-17-24 Wednesday Report to the Registration Desk on the 1st floor of the Medical Mall.Then proceed to the 2nd floor Surgery Desk To find out your arrival time, please call [phone] between 1PM - 3PM on:07-16-24 Tuesday If your arrival time is 6:00 am, do not arrive before that time as the [facility] Mall entrance doors do not open until 6:00 am.  REMEMBER: Instructions that are not followed completely may result in serious medical risk, up to and including death; or upon the discretion of your surgeon and anesthesiologist your surgery may need to be rescheduled.  Do not eat food after midnight the night before surgery.  No gum chewing or hard candies.  You may however, drink CLEAR liquids up to 2 hours before you are scheduled to arrive for your surgery. Do not drink anything within 2 hours of your scheduled arrival time.  Clear liquids include: - water   - apple juice without pulp - gatorade (not RED colors) - black coffee or tea (Do NOT add milk or creamers to the coffee or tea) Do NOT drink anything that is not on this list.  One week prior to surgery:Last dose will be on 07-09-24 Tuesday Stop Anti-inflammatories (NSAIDS) such as Advil, Aleve, Ibuprofen, Motrin, Naproxen, Naprosyn and Aspirin based products such as Excedrin, Goody's Powder, BC Powder. Stop ANY OVER THE COUNTER supplements until after surgery.  You may however, continue to take Tylenol  if needed for pain up until the day of surgery.  Stop sildenafil  (VIAGRA ) 2 days prior to surgery-Last dose will be on 07-14-24 Sunday  Continue taking all of your other prescription medications up until the day of surgery.  Do NOT take any medication the day of surgery  No Alcohol for 24 hours before or after surgery.  No Smoking including e-cigarettes for 24 hours before surgery.  No chewable tobacco products for at least 6 hours before surgery.  No nicotine patches on the day of surgery.  Do  not use any recreational drugs for at least a week (preferably 2 weeks) before your surgery.  Please be advised that the combination of cocaine and anesthesia may have negative outcomes, up to and including death. If you test positive for cocaine, your surgery will be cancelled.  On the morning of surgery brush your teeth with toothpaste and water , you may rinse your mouth with mouthwash if you wish. Do not swallow any toothpaste or mouthwash.  Use CHG Soap as directed on instruction sheet.  Do not wear jewelry, make-up, hairpins, clips or nail polish.  For welded (permanent) jewelry: bracelets, anklets, waist bands, etc.  Please have this removed prior to surgery.  If it is not removed, there is a chance that hospital personnel will need to cut it off on the day of surgery.  Do not wear lotions, powders, or perfumes.   Do not shave body hair from the neck down 48 hours before surgery.  Contact lenses, hearing aids and dentures may not be worn into surgery.  Do not bring valuables to the hospital. [facility] is not responsible for any missing/lost belongings or valuables.   Notify your doctor if there is any change in your medical condition (cold, fever, infection).  Wear comfortable clothing (specific to your surgery type) to the hospital.  After surgery, you can help prevent lung complications by doing breathing exercises.  Take deep breaths and cough every 1-2 hours. Your doctor may order a device called an Incentive Spirometer to help you take deep  breaths. When coughing or sneezing, hold a pillow firmly against your incision with both hands. This is called "splinting." Doing this helps protect your incision. It also decreases belly discomfort.  If you are being admitted to the hospital overnight, leave your suitcase in the car. After surgery it may be brought to your room.  In case of increased patient census, it may be necessary for you, the patient, to continue your  postoperative care in the Same Day Surgery department.  If you are being discharged the day of surgery, you will not be allowed to drive home. You will need a responsible individual to drive you home and stay with you for 24 hours after surgery.   If you are taking public transportation, you will need to have a responsible individual with you.  Please call the Pre-admissions Testing Dept. at [phone] if you have any questions about these instructions.  Surgery Visitation Policy:  Patients having surgery or a procedure may have two visitors.  Children under the age of 42 must have an adult with them who is not the patient.                                                                                                             Preparing for Surgery with CHLORHEXIDINE GLUCONATE (CHG) Soap  Chlorhexidine Gluconate (CHG) Soap  o An antiseptic cleaner that kills germs and bonds with the skin to continue killing germs even after washing  o Used for showering the night before surgery and morning of surgery  Before surgery, you can play an important role by reducing the number of germs on your skin.  CHG (Chlorhexidine gluconate) soap is an antiseptic cleanser which kills germs and bonds with the skin to continue killing germs even after washing.  Please do not use if you have an allergy to CHG or antibacterial soaps. If your skin becomes reddened/irritated stop using the CHG.  1. Shower the NIGHT BEFORE SURGERY with CHG soap.  2. If you choose to wash your hair, wash your hair first as usual with your normal shampoo.  3. After shampooing, rinse your hair and body thoroughly to remove the shampoo.  4. Use CHG as you would any other liquid soap. You can apply CHG directly to the skin and wash gently with a clean washcloth.  5. Apply the CHG soap to your body only from the neck down. Do not use on open wounds or open sores. Avoid contact with your eyes, ears, mouth, and genitals  (private parts). Wash face and genitals (private parts) with your normal soap.  6. Wash thoroughly, paying special attention to the area where your surgery will be performed.  7. Thoroughly rinse your body with warm water .  8. Do not shower/wash with your normal soap after using and rinsing off the CHG soap.  9. Do not use lotions, oils, etc., after showering with CHG.  10. Pat yourself dry with a clean towel.  11. Wear clean pajamas to bed the night before surgery.  12. Place clean  sheets on your bed the night of your shower and do not sleep with pets.  13. Do not apply any deodorants/lotions/powders.  14. Please wear clean clothes to the hospital.  15. Remember to brush your teeth with your regular toothpaste.   Merchandiser, Retail to address health-related social needs:  [email or web address]

## 2024-07-10 ENCOUNTER — Ambulatory Visit: Admitting: Surgery

## 2024-07-10 ENCOUNTER — Encounter: Payer: Self-pay | Admitting: Surgery

## 2024-07-10 VITALS — BP 136/90 | HR 85 | Temp 98.4°F | Ht 71.25 in | Wt 210.2 lb

## 2024-07-10 DIAGNOSIS — K4091 Unilateral inguinal hernia, without obstruction or gangrene, recurrent: Secondary | ICD-10-CM | POA: Diagnosis not present

## 2024-07-10 DIAGNOSIS — K4090 Unilateral inguinal hernia, without obstruction or gangrene, not specified as recurrent: Secondary | ICD-10-CM

## 2024-07-10 NOTE — Patient Instructions (Signed)
 You have chose to have your hernia repaired. This will be done by Dr. Aleen Campi at [facility].  If you are on any injectable weight loss medication, you will need to stop taking your GLP-1 injectable (weight loss) medications 8 days before your surgery to avoid any complications with anesthesia.   Please see your (blue) Pre-care information that you have been given today. Our surgery scheduler will call you to verify surgery date and to go over information.   You will need to arrange to be out of work for approximately 1-2 weeks and then you may return with a lifting restriction for 4 more weeks. If you have FMLA or Disability paperwork that needs to be filled out, please have your company fax your paperwork to [phone] or you may drop this by either office. This paperwork will be filled out within 3 days after your surgery has been completed.  You may have a bruise in your groin and also swelling and brusing in your testicle area. You may use ice 4-5 times daily for 15-20 minutes each time. Make sure that you place a barrier between you and the ice pack. To decrease the swelling, you may roll up a bath towel and place it vertically in between your thighs with your testicles resting on the towel. You will want to keep this area elevated as much as possible for several days following surgery.    Inguinal Hernia, Adult Muscles help keep everything in the body in its proper place. But if a weak spot in the muscles develops, something can poke through. That is called a hernia. When this happens in the lower part of the belly (abdomen), it is called an inguinal hernia. (It takes its name from a part of the body in this region called the inguinal canal.) A weak spot in the wall of muscles lets some fat or part of the small intestine bulge through. An inguinal hernia can develop at any age. Men get them more often than women. CAUSES  In adults, an inguinal hernia develops over time. It can be  triggered by: Suddenly straining the muscles of the lower abdomen. Lifting heavy objects. Straining to have a bowel movement. Difficult bowel movements (constipation) can lead to this. Constant coughing. This may be caused by smoking or lung disease. Being overweight. Being pregnant. Working at a job that requires long periods of standing or heavy lifting. Having had an inguinal hernia before. One type can be an emergency situation. It is called a strangulated inguinal hernia. It develops if part of the small intestine slips through the weak spot and cannot get back into the abdomen. The blood supply can be cut off. If that happens, part of the intestine may die. This situation requires emergency surgery. SYMPTOMS  Often, a small inguinal hernia has no symptoms. It is found when a healthcare provider does a physical exam. Larger hernias usually have symptoms.  In adults, symptoms may include: A lump in the groin. This is easier to see when the person is standing. It might disappear when lying down. In men, a lump in the scrotum. Pain or burning in the groin. This occurs especially when lifting, straining or coughing. A dull ache or feeling of pressure in the groin. Signs of a strangulated hernia can include: A bulge in the groin that becomes very painful and tender to the touch. A bulge that turns red or purple. Fever, nausea and vomiting. Inability to have a bowel movement or to pass gas.  DIAGNOSIS  To decide if you have an inguinal hernia, a healthcare provider will probably do a physical examination. This will include asking questions about any symptoms you have noticed. The healthcare provider might feel the groin area and ask you to cough. If an inguinal hernia is felt, the healthcare provider may try to slide it back into the abdomen. Usually no other tests are needed. TREATMENT  Treatments can vary. The size of the hernia makes a difference. Options include: Watchful waiting. This  is often suggested if the hernia is small and you have had no symptoms. No medical procedure will be done unless symptoms develop. You will need to watch closely for symptoms. If any occur, contact your healthcare provider right away. Surgery. This is used if the hernia is larger or you have symptoms. Open surgery. This is usually an outpatient procedure (you will not stay overnight in a hospital). An cut (incision) is made through the skin in the groin. The hernia is put back inside the abdomen. The weak area in the muscles is then repaired by herniorrhaphy or hernioplasty. Herniorrhaphy: in this type of surgery, the weak muscles are sewn back together. Hernioplasty: a patch or mesh is used to close the weak area in the abdominal wall. Laparoscopy. In this procedure, a surgeon makes small incisions. A thin tube with a tiny video camera (called a laparoscope) is put into the abdomen. The surgeon repairs the hernia with mesh by looking with the video camera and using two long instruments. HOME CARE INSTRUCTIONS  After surgery to repair an inguinal hernia: You will need to take pain medicine prescribed by your healthcare provider. Follow all directions carefully. You will need to take care of the wound from the incision. Your activity will be restricted for awhile. This will probably include no heavy lifting for several weeks. You also should not do anything too active for a few weeks. When you can return to work will depend on the type of job that you have. During "watchful waiting" periods, you should: Maintain a healthy weight. Eat a diet high in fiber (fruits, vegetables and whole grains). Drink plenty of fluids to avoid constipation. This means drinking enough water and other liquids to keep your urine clear or pale yellow. Do not lift heavy objects. Do not stand for long periods of time. Quit smoking. This should keep you from developing a frequent cough. SEEK MEDICAL CARE IF:  A bulge  develops in your groin area. You feel pain, a burning sensation or pressure in the groin. This might be worse if you are lifting or straining. You develop a fever of more than 100.5 F (38.1 C). SEEK IMMEDIATE MEDICAL CARE IF:  Pain in the groin increases suddenly. A bulge in the groin gets bigger suddenly and does not go down. For men, there is sudden pain in the scrotum. Or, the size of the scrotum increases. A bulge in the groin area becomes red or purple and is painful to touch. You have nausea or vomiting that does not go away. You feel your heart beating much faster than normal. You cannot have a bowel movement or pass gas. You develop a fever of more than 102.0 F (38.9 C).   This information is not intended to replace advice given to you by your health care provider. Make sure you discuss any questions you have with your health care provider.   Document Released: 01/01/2009 Document Revised: 11/07/2011 Document Reviewed: 02/16/2015 Elsevier Interactive Patient Education [employer]

## 2024-07-10 NOTE — Progress Notes (Signed)
 07/10/2024  History of Present Illness: Philip Reynolds is a 53 y.o. male presenting for H&P update and follow up of bilateral inguinal hernias.  He was last seen on 02/28/24 at which time he wanted to wait some time before surgery.  He has been limiting the intensity of his workouts, but he continues to notice discomfort in the left groin during his workouts.  Denies any issues in the right groin.    He has prior laparoscopic right inguinal hernia repair in 2014.  On his last exam, he had possibly small right inguinal hernia near the crease with the groin/thigh, and a small hernia in the left groin.  Past Medical History: Past Medical History:  Diagnosis Date   Basal cell carcinoma 02/16/2022   R post base of neck, superfical, needs EDC   Basal cell carcinoma of back    R back txted years ago   ED (erectile dysfunction)    GERD (gastroesophageal reflux disease)    Hair loss    Herpes infection 10/10/2014   Left inguinal hernia    Mixed hyperlipidemia    Pre-diabetes    Umbilical hernia without mention of obstruction or gangrene      Past Surgical History: Past Surgical History:  Procedure Laterality Date   ANTERIOR CRUCIATE LIGAMENT REPAIR (aka ACL) and meniscectomy  02/27/2007   Right knee (Dr. Memory)   COLONOSCOPY WITH PROPOFOL  N/A 12/22/2020   Procedure: COLONOSCOPY WITH POLYPECTOMY;  Surgeon: Janalyn Keene NOVAK, MD;  Location: [facility];  Service: Endoscopy;  Laterality: N/A;  priority 4   INGUINAL HERNIA REPAIR Right 08/06/2013   UMBILICAL HERNIA REPAIR  08/06/2013    Home Medications: Prior to Admission medications   Medication Sig Start Date End Date Taking? Authorizing Provider  minoxidil  (LONITEN ) 2.5 MG tablet Take 1 tablet (2.5 mg total) by mouth daily. Patient taking differently: Take 2.5 mg by mouth daily. Takes for Hair Loss 06/18/24  Yes Hester Alm BROCKS, MD  sildenafil  (VIAGRA ) 100 MG tablet Take 0.5-1 tablets (50-100 mg total) by mouth daily  as needed for erectile dysfunction. 05/27/24  Yes Copland, Jacques, MD  valACYclovir (VALTREX) 1000 MG tablet Take 2 tablets (2,000 mg total) by mouth 2 (two) times daily. For 24 hours for outbreak Patient taking differently: Take 2,000 mg by mouth 2 (two) times daily as needed (outbreaks). 05/27/24  Yes Copland, Jacques, MD    Allergies: No Known Allergies  Review of Systems: Review of Systems  Constitutional:  Negative for chills and fever.  Respiratory:  Negative for shortness of breath.   Cardiovascular:  Negative for chest pain.  Gastrointestinal:  Positive for abdominal pain (left groin). Negative for nausea and vomiting.    Physical Exam BP (!) 136/90   Pulse 85   Temp 98.4 F (36.9 C) (Oral)   Ht 5' 11.25 (1.81 m)   Wt 210 lb 3.2 oz (95.3 kg)   SpO2 96%   BMI 29.11 kg/m  CONSTITUTIONAL: No acute distress HEENT:  Normocephalic, atraumatic, extraocular motion intact. RESPIRATORY: Normal respiratory effort without pathologic use of accessory muscles. CARDIOVASCULAR: Regular rhythm and rate. GI: The abdomen is soft, non-distended, non-tender to palpation.  The patient has a small left inguinal hernia which causes discomfort when it protrudes.  On the right groin, there is a small subtle hernia at the crease with the groin.  This is stable and also asymptomatic. No evidence of recurrent umbilical hernia. NEUROLOGIC:  Motor and sensation is grossly normal.  Cranial nerves are grossly intact.  PSYCH:  Alert and oriented to person, place and time. Affect is normal.  Labs/Imaging: Labs from 05/20/24: Na 139, K 4.6, Cl 105, CO2 26, BUN 26, Cr 1.03.  LFTs within normal limits.  WBC 5.7, Hgb 15.9, Hct 46.5, Plt 255.  HgA1c 5.7.  Assessment and Plan: This is a 53 y.o. male with left inguinal hernia, recurrent right inguinal hernia.  --Discussed with the patient that on exam there is evidence to suggest a recurrence of his right inguinal hernia.  Overall both areas are stable, without  worsening size.  Both are reducible.   --Discussed with him the surgical plan for a robotic left inguinal hernia repair.  At the time, we can evaluate the right groin and potentially try to repair it robotically, but given mesh and scarring, may prove more difficult and increase his risk for injury.  If that's the case, then we would convert to open right inguinal hernia repair.  Discussed with him the surgery at length including the planned incisions, risks of bleeding, infection, injury to surrounding structures, chronic pain, that this would be an outpatient surgery, post-operative activity restrictions, pain control, and he's willing to proceed. --Patient is scheduled for surgery on 07/17/24.  All of his questions have been answered.  I spent 30 minutes dedicated to the care of this patient on the date of this encounter to include pre-visit review of records, face-to-face time with the patient discussing diagnosis and management, and any post-visit coordination of care.   Aloysius Sheree Plant, MD [facility]

## 2024-07-10 NOTE — H&P (View-Only) (Signed)
 07/10/2024  History of Present Illness: Philip Reynolds is a 53 y.o. male presenting for H&P update and follow up of bilateral inguinal hernias.  He was last seen on 02/28/24 at which time he wanted to wait some time before surgery.  He has been limiting the intensity of his workouts, but he continues to notice discomfort in the left groin during his workouts.  Denies any issues in the right groin.    He has prior laparoscopic right inguinal hernia repair in 2014.  On his last exam, he had possibly small right inguinal hernia near the crease with the groin/thigh, and a small hernia in the left groin.  Past Medical History: Past Medical History:  Diagnosis Date   Basal cell carcinoma 02/16/2022   R post base of neck, superfical, needs EDC   Basal cell carcinoma of back    R back txted years ago   ED (erectile dysfunction)    GERD (gastroesophageal reflux disease)    Hair loss    Herpes infection 10/10/2014   Left inguinal hernia    Mixed hyperlipidemia    Pre-diabetes    Umbilical hernia without mention of obstruction or gangrene      Past Surgical History: Past Surgical History:  Procedure Laterality Date   ANTERIOR CRUCIATE LIGAMENT REPAIR (aka ACL) and meniscectomy  02/27/2007   Right knee (Dr. Memory)   COLONOSCOPY WITH PROPOFOL  N/A 12/22/2020   Procedure: COLONOSCOPY WITH POLYPECTOMY;  Surgeon: Janalyn Keene NOVAK, MD;  Location: [facility];  Service: Endoscopy;  Laterality: N/A;  priority 4   INGUINAL HERNIA REPAIR Right 08/06/2013   UMBILICAL HERNIA REPAIR  08/06/2013    Home Medications: Prior to Admission medications   Medication Sig Start Date End Date Taking? Authorizing Provider  minoxidil  (LONITEN ) 2.5 MG tablet Take 1 tablet (2.5 mg total) by mouth daily. Patient taking differently: Take 2.5 mg by mouth daily. Takes for Hair Loss 06/18/24  Yes Hester Alm BROCKS, MD  sildenafil  (VIAGRA ) 100 MG tablet Take 0.5-1 tablets (50-100 mg total) by mouth daily  as needed for erectile dysfunction. 05/27/24  Yes Copland, Jacques, MD  valACYclovir (VALTREX) 1000 MG tablet Take 2 tablets (2,000 mg total) by mouth 2 (two) times daily. For 24 hours for outbreak Patient taking differently: Take 2,000 mg by mouth 2 (two) times daily as needed (outbreaks). 05/27/24  Yes Copland, Jacques, MD    Allergies: No Known Allergies  Review of Systems: Review of Systems  Constitutional:  Negative for chills and fever.  Respiratory:  Negative for shortness of breath.   Cardiovascular:  Negative for chest pain.  Gastrointestinal:  Positive for abdominal pain (left groin). Negative for nausea and vomiting.    Physical Exam BP (!) 136/90   Pulse 85   Temp 98.4 F (36.9 C) (Oral)   Ht 5' 11.25 (1.81 m)   Wt 210 lb 3.2 oz (95.3 kg)   SpO2 96%   BMI 29.11 kg/m  CONSTITUTIONAL: No acute distress HEENT:  Normocephalic, atraumatic, extraocular motion intact. RESPIRATORY: Normal respiratory effort without pathologic use of accessory muscles. CARDIOVASCULAR: Regular rhythm and rate. GI: The abdomen is soft, non-distended, non-tender to palpation.  The patient has a small left inguinal hernia which causes discomfort when it protrudes.  On the right groin, there is a small subtle hernia at the crease with the groin.  This is stable and also asymptomatic. No evidence of recurrent umbilical hernia. NEUROLOGIC:  Motor and sensation is grossly normal.  Cranial nerves are grossly intact.  PSYCH:  Alert and oriented to person, place and time. Affect is normal.  Labs/Imaging: Labs from 05/20/24: Na 139, K 4.6, Cl 105, CO2 26, BUN 26, Cr 1.03.  LFTs within normal limits.  WBC 5.7, Hgb 15.9, Hct 46.5, Plt 255.  HgA1c 5.7.  Assessment and Plan: This is a 53 y.o. male with left inguinal hernia, recurrent right inguinal hernia.  --Discussed with the patient that on exam there is evidence to suggest a recurrence of his right inguinal hernia.  Overall both areas are stable, without  worsening size.  Both are reducible.   --Discussed with him the surgical plan for a robotic left inguinal hernia repair.  At the time, we can evaluate the right groin and potentially try to repair it robotically, but given mesh and scarring, may prove more difficult and increase his risk for injury.  If that's the case, then we would convert to open right inguinal hernia repair.  Discussed with him the surgery at length including the planned incisions, risks of bleeding, infection, injury to surrounding structures, chronic pain, that this would be an outpatient surgery, post-operative activity restrictions, pain control, and he's willing to proceed. --Patient is scheduled for surgery on 07/17/24.  All of his questions have been answered.  I spent 30 minutes dedicated to the care of this patient on the date of this encounter to include pre-visit review of records, face-to-face time with the patient discussing diagnosis and management, and any post-visit coordination of care.   Aloysius Sheree Plant, MD [facility]

## 2024-07-17 ENCOUNTER — Ambulatory Visit: Admitting: Certified Registered Nurse Anesthetist

## 2024-07-17 ENCOUNTER — Other Ambulatory Visit: Payer: Self-pay

## 2024-07-17 ENCOUNTER — Encounter: Payer: Self-pay | Admitting: Surgery

## 2024-07-17 ENCOUNTER — Encounter
Admission: RE | Disposition: A | Discharge status: Home / Self Care | Payer: Self-pay | Source: Ambulatory Visit | Attending: Surgery

## 2024-07-17 ENCOUNTER — Ambulatory Visit
Admission: RE | Admit: 2024-07-17 | Discharge: 2024-07-17 | Disposition: A | Discharge status: Home / Self Care | Source: Ambulatory Visit | Attending: Surgery | Admitting: Surgery

## 2024-07-17 DIAGNOSIS — K4091 Unilateral inguinal hernia, without obstruction or gangrene, recurrent: Secondary | ICD-10-CM | POA: Insufficient documentation

## 2024-07-17 DIAGNOSIS — K219 Gastro-esophageal reflux disease without esophagitis: Secondary | ICD-10-CM | POA: Insufficient documentation

## 2024-07-17 DIAGNOSIS — K4090 Unilateral inguinal hernia, without obstruction or gangrene, not specified as recurrent: Secondary | ICD-10-CM | POA: Insufficient documentation

## 2024-07-17 DIAGNOSIS — Z5331 Laparoscopic surgical procedure converted to open procedure: Secondary | ICD-10-CM | POA: Diagnosis not present

## 2024-07-17 DIAGNOSIS — K4020 Bilateral inguinal hernia, without obstruction or gangrene, not specified as recurrent: Secondary | ICD-10-CM | POA: Diagnosis not present

## 2024-07-17 HISTORY — PX: HERNIORRHAPHY, INGUINAL, ROBOT-ASSISTED, LAPAROSCOPIC: SHX7585

## 2024-07-17 HISTORY — PX: INGUINAL HERNIA REPAIR: SHX194

## 2024-07-17 SURGERY — HERNIORRHAPHY, INGUINAL, ROBOT-ASSISTED, LAPAROSCOPIC
Anesthesia: General | Laterality: Right

## 2024-07-17 MED ORDER — GABAPENTIN 300 MG PO CAPS
300.0000 mg | ORAL_CAPSULE | ORAL | Status: AC
  Administered 2024-07-17: 300 mg via ORAL

## 2024-07-17 MED ORDER — BUPIVACAINE LIPOSOME 1.3 % IJ SUSP
INTRAMUSCULAR | Status: AC
  Filled 2024-07-17: qty 20

## 2024-07-17 MED ORDER — ACETAMINOPHEN 500 MG PO TABS
1000.0000 mg | ORAL_TABLET | ORAL | Status: AC
  Administered 2024-07-17: 1000 mg via ORAL

## 2024-07-17 MED ORDER — OXYCODONE HCL 5 MG PO TABS
ORAL_TABLET | ORAL | Status: AC
  Filled 2024-07-17: qty 1

## 2024-07-17 MED ORDER — CHLORHEXIDINE GLUCONATE 0.12 % MT SOLN
15.0000 mL | Freq: Once | OROMUCOSAL | Status: AC
  Administered 2024-07-17: 15 mL via OROMUCOSAL

## 2024-07-17 MED ORDER — SUGAMMADEX SODIUM 200 MG/2ML IV SOLN
INTRAVENOUS | Status: DC | PRN
  Administered 2024-07-17: 200 mg via INTRAVENOUS

## 2024-07-17 MED ORDER — OXYCODONE HCL 5 MG PO TABS: 5.0000 mg | ORAL_TABLET | ORAL | 0 refills | Status: DC | PRN

## 2024-07-17 MED ORDER — GABAPENTIN 300 MG PO CAPS
ORAL_CAPSULE | ORAL | Status: AC
  Filled 2024-07-17: qty 1

## 2024-07-17 MED ORDER — BUPIVACAINE-EPINEPHRINE (PF) 0.5% -1:200000 IJ SOLN
INTRAMUSCULAR | Status: AC
  Filled 2024-07-17: qty 30

## 2024-07-17 MED ORDER — CHLORHEXIDINE GLUCONATE CLOTH 2 % EX PADS: 6.0000 | MEDICATED_PAD | Freq: Once | CUTANEOUS | Status: DC

## 2024-07-17 MED ORDER — SEVOFLURANE IN SOLN
RESPIRATORY_TRACT | Status: AC
Start: 2024-07-17 — End: 2024-07-17
  Filled 2024-07-17: qty 250

## 2024-07-17 MED ORDER — CEFAZOLIN SODIUM-DEXTROSE 2-4 GM/100ML-% IV SOLN
INTRAVENOUS | Status: AC
  Filled 2024-07-17: qty 100

## 2024-07-17 MED ORDER — ACETAMINOPHEN 500 MG PO TABS: 1000.0000 mg | ORAL_TABLET | Freq: Four times a day (QID) | ORAL | Status: AC | PRN

## 2024-07-17 MED ORDER — BUPIVACAINE-EPINEPHRINE (PF) 0.5% -1:200000 IJ SOLN
INTRAMUSCULAR | Status: DC | PRN
  Administered 2024-07-17: 50 mL via INTRAMUSCULAR

## 2024-07-17 MED ORDER — MIDAZOLAM HCL 2 MG/2ML IJ SOLN
INTRAMUSCULAR | Status: AC
  Filled 2024-07-17: qty 2

## 2024-07-17 MED ORDER — PHENYLEPHRINE 80 MCG/ML (10ML) SYRINGE FOR IV PUSH (FOR BLOOD PRESSURE SUPPORT)
PREFILLED_SYRINGE | INTRAVENOUS | Status: DC | PRN
Start: 2024-07-17 — End: 2024-07-17
  Administered 2024-07-17: 80 ug via INTRAVENOUS
  Administered 2024-07-17 (×3): 160 ug via INTRAVENOUS
  Administered 2024-07-17: 80 ug via INTRAVENOUS
  Administered 2024-07-17: 160 ug via INTRAVENOUS

## 2024-07-17 MED ORDER — MIDAZOLAM HCL (PF) 2 MG/2ML IJ SOLN
INTRAMUSCULAR | Status: DC | PRN
Start: 2024-07-17 — End: 2024-07-17
  Administered 2024-07-17: 2 mg via INTRAVENOUS

## 2024-07-17 MED ORDER — 0.9 % SODIUM CHLORIDE (POUR BTL) OPTIME
TOPICAL | Status: DC | PRN
  Administered 2024-07-17: 500 mL

## 2024-07-17 MED ORDER — PROPOFOL 10 MG/ML IV BOLUS
INTRAVENOUS | Status: DC | PRN
Start: 2024-07-17 — End: 2024-07-17
  Administered 2024-07-17: 200 mg via INTRAVENOUS

## 2024-07-17 MED ORDER — ONDANSETRON HCL 4 MG/2ML IJ SOLN
INTRAMUSCULAR | Status: AC
  Filled 2024-07-17: qty 2

## 2024-07-17 MED ORDER — HYDROMORPHONE HCL 1 MG/ML IJ SOLN
INTRAMUSCULAR | Status: AC
  Filled 2024-07-17: qty 1

## 2024-07-17 MED ORDER — ORAL CARE MOUTH RINSE: 15.0000 mL | Freq: Once | OROMUCOSAL | Status: AC

## 2024-07-17 MED ORDER — OXYCODONE HCL 5 MG PO TABS
5.0000 mg | ORAL_TABLET | Freq: Once | ORAL | Status: AC | PRN
  Administered 2024-07-17: 5 mg via ORAL

## 2024-07-17 MED ORDER — BUPIVACAINE LIPOSOME 1.3 % IJ SUSP: 20.0000 mL | Freq: Once | INTRAMUSCULAR | Status: DC

## 2024-07-17 MED ORDER — FENTANYL CITRATE (PF) 100 MCG/2ML IJ SOLN
INTRAMUSCULAR | Status: AC
  Filled 2024-07-17: qty 2

## 2024-07-17 MED ORDER — HYDROMORPHONE HCL 1 MG/ML IJ SOLN
0.2500 mg | INTRAMUSCULAR | Status: DC | PRN
  Administered 2024-07-17 (×4): 0.5 mg via INTRAVENOUS

## 2024-07-17 MED ORDER — DEXMEDETOMIDINE HCL IN NACL 80 MCG/20ML IV SOLN
INTRAVENOUS | Status: DC | PRN
  Administered 2024-07-17: 12 ug via INTRAVENOUS

## 2024-07-17 MED ORDER — CEFAZOLIN SODIUM-DEXTROSE 2-4 GM/100ML-% IV SOLN
2.0000 g | INTRAVENOUS | Status: AC
  Administered 2024-07-17: 2 g via INTRAVENOUS

## 2024-07-17 MED ORDER — DEXAMETHASONE SOD PHOSPHATE PF 10 MG/ML IJ SOLN
INTRAMUSCULAR | Status: DC | PRN
  Administered 2024-07-17: 10 mg via INTRAVENOUS

## 2024-07-17 MED ORDER — CHLORHEXIDINE GLUCONATE 0.12 % MT SOLN
OROMUCOSAL | Status: AC
  Filled 2024-07-17: qty 15

## 2024-07-17 MED ORDER — ACETAMINOPHEN 500 MG PO TABS
ORAL_TABLET | ORAL | Status: AC
  Filled 2024-07-17: qty 2

## 2024-07-17 MED ORDER — LIDOCAINE HCL (CARDIAC) PF 100 MG/5ML IV SOSY
PREFILLED_SYRINGE | INTRAVENOUS | Status: DC | PRN
  Administered 2024-07-17: 100 mg via INTRAVENOUS

## 2024-07-17 MED ORDER — ROCURONIUM BROMIDE 10 MG/ML (PF) SYRINGE
PREFILLED_SYRINGE | INTRAVENOUS | Status: AC
Start: 2024-07-17 — End: 2024-07-17
  Filled 2024-07-17: qty 10

## 2024-07-17 MED ORDER — PROPOFOL 10 MG/ML IV BOLUS
INTRAVENOUS | Status: AC
Start: 2024-07-17 — End: 2024-07-17
  Filled 2024-07-17: qty 40

## 2024-07-17 MED ORDER — IBUPROFEN 600 MG PO TABS: 600.0000 mg | ORAL_TABLET | Freq: Three times a day (TID) | ORAL | 1 refills | Status: AC | PRN

## 2024-07-17 MED ORDER — ROCURONIUM BROMIDE 100 MG/10ML IV SOLN
INTRAVENOUS | Status: DC | PRN
  Administered 2024-07-17: 10 mg via INTRAVENOUS
  Administered 2024-07-17: 60 mg via INTRAVENOUS
  Administered 2024-07-17: 10 mg via INTRAVENOUS

## 2024-07-17 MED ORDER — FENTANYL CITRATE (PF) 100 MCG/2ML IJ SOLN
INTRAMUSCULAR | Status: DC | PRN
  Administered 2024-07-17: 100 ug via INTRAVENOUS
  Administered 2024-07-17 (×2): 50 ug via INTRAVENOUS

## 2024-07-17 MED ORDER — LACTATED RINGERS IV SOLN: INTRAVENOUS | Status: DC

## 2024-07-17 MED ORDER — ONDANSETRON HCL 4 MG/2ML IJ SOLN
INTRAMUSCULAR | Status: DC | PRN
  Administered 2024-07-17: 4 mg via INTRAVENOUS

## 2024-07-17 MED ORDER — KETOROLAC TROMETHAMINE 30 MG/ML IJ SOLN
INTRAMUSCULAR | Status: DC | PRN
Start: 2024-07-17 — End: 2024-07-17
  Administered 2024-07-17: 30 mg via INTRAVENOUS

## 2024-07-17 MED ORDER — OXYCODONE HCL 5 MG/5ML PO SOLN: 5.0000 mg | Freq: Once | ORAL | Status: AC | PRN

## 2024-07-17 SURGICAL SUPPLY — 56 items
BLADE SURG 15 STRL LF DISP TIS (BLADE) ×2 IMPLANT
CHLORAPREP W/TINT 26 (MISCELLANEOUS) IMPLANT
COVER TIP SHEARS 8 DVNC (MISCELLANEOUS) ×2 IMPLANT
COVER WAND RF STERILE (DRAPES) ×2 IMPLANT
DEFOGGER SCOPE WARM SEASHARP (MISCELLANEOUS) ×2 IMPLANT
DERMABOND ADVANCED .7 DNX12 (GAUZE/BANDAGES/DRESSINGS) ×2 IMPLANT
DRAIN PENROSE 12X.25 LTX STRL (MISCELLANEOUS) ×2 IMPLANT
DRAPE ARM DVNC X/XI (DISPOSABLE) ×6 IMPLANT
DRAPE COLUMN DVNC XI (DISPOSABLE) ×2 IMPLANT
DRAPE LAPAROTOMY 100X77 ABD (DRAPES) ×2 IMPLANT
ELECTRODE CAUTERY BLDE TIP 2.5 (TIP) ×2 IMPLANT
ELECTRODE REM PT RTRN 9FT ADLT (ELECTROSURGICAL) ×2 IMPLANT
FORCEPS BPLR R/ABLATION 8 DVNC (INSTRUMENTS) ×2 IMPLANT
GAUZE 4X4 16PLY ~~LOC~~+RFID DBL (SPONGE) IMPLANT
GLOVE SURG SYN 7.0 PF PI (GLOVE) ×4 IMPLANT
GLOVE SURG SYN 7.5 PF PI (GLOVE) ×4 IMPLANT
GOWN STRL REUS W/ TWL LRG LVL3 (GOWN DISPOSABLE) ×8 IMPLANT
IRRIGATION STRYKERFLOW (MISCELLANEOUS) IMPLANT
IV 0.9% NACL 1000 ML (IV SOLUTION) IMPLANT
IV CATH ANGIO 12GX3 LT BLUE (NEEDLE) IMPLANT
KIT PINK PAD W/HEAD ARM REST (MISCELLANEOUS) ×2 IMPLANT
LABEL OR SOLS (LABEL) ×2 IMPLANT
MANIFOLD NEPTUNE II (INSTRUMENTS) ×2 IMPLANT
MESH 3DMAX MID 4X6 LT LRG (Mesh General) IMPLANT
MESH MARLEX PLUG MEDIUM (Mesh General) IMPLANT
NDL DRIVE SUT CUT DVNC (INSTRUMENTS) ×2 IMPLANT
NDL HYPO 22X1.5 SAFETY MO (MISCELLANEOUS) ×2 IMPLANT
NDL INSUFFLATION 14GA 120MM (NEEDLE) ×2 IMPLANT
NEEDLE DRIVE SUT CUT DVNC (INSTRUMENTS) ×2 IMPLANT
NEEDLE HYPO 22X1.5 SAFETY MO (MISCELLANEOUS) ×2 IMPLANT
NEEDLE INSUFFLATION 14GA 120MM (NEEDLE) ×2 IMPLANT
NS IRRIG 500ML POUR BTL (IV SOLUTION) ×2 IMPLANT
OBTURATOR OPTICALSTD 8 DVNC (TROCAR) ×2 IMPLANT
PACK BASIN MINOR ARMC (MISCELLANEOUS) ×2 IMPLANT
PACK LAP CHOLECYSTECTOMY (MISCELLANEOUS) ×2 IMPLANT
SCISSORS MNPLR CVD DVNC XI (INSTRUMENTS) ×2 IMPLANT
SEAL UNIV 5-12 XI (MISCELLANEOUS) ×6 IMPLANT
SET TUBE SMOKE EVAC HIGH FLOW (TUBING) ×2 IMPLANT
SOLUTION ELECTROSURG ANTI STCK (MISCELLANEOUS) ×2 IMPLANT
SPONGE T-LAP 18X18 ~~LOC~~+RFID (SPONGE) ×2 IMPLANT
SUT MNCRL AB 4-0 PS2 18 (SUTURE) ×2 IMPLANT
SUT PROLENE 2 0 SH DA (SUTURE) ×4 IMPLANT
SUT SILK 2-0 30XBRD TIE 12 (SUTURE) ×2 IMPLANT
SUT STRATA 2-0 23CM CT-2 (SUTURE) ×2 IMPLANT
SUT VIC AB 2-0 CT1 (SUTURE) ×2 IMPLANT
SUT VIC AB 2-0 SH 27XBRD (SUTURE) ×2 IMPLANT
SUT VIC AB 3-0 SH 27X BRD (SUTURE) ×2 IMPLANT
SUT VICRYL 0 UR6 27IN ABS (SUTURE) ×4 IMPLANT
SUTURE MNCRL 4-0 27XMF (SUTURE) ×2 IMPLANT
SYR 10ML LL (SYRINGE) ×2 IMPLANT
SYR 30ML LL (SYRINGE) ×2 IMPLANT
SYSTEM BAG RETRIEVAL 10MM (BASKET) IMPLANT
TAPE TRANSPORE STRL 2 31045 (GAUZE/BANDAGES/DRESSINGS) ×2 IMPLANT
TRAP FLUID SMOKE EVACUATOR (MISCELLANEOUS) ×2 IMPLANT
TRAY FOLEY SLVR 16FR LF STAT (SET/KITS/TRAYS/PACK) ×2 IMPLANT
WATER STERILE IRR 500ML POUR (IV SOLUTION) ×2 IMPLANT

## 2024-07-17 NOTE — Op Note (Signed)
 Procedure Date:  07/17/2024  Pre-operative Diagnosis:  Left inguinal hernia, recurrent right inguinal hernia  Post-operative Diagnosis: Left inguinal hernia, recurrent right inguinal hernia.  Procedure: 1.  Robotic assisted left Inguinal Hernia Repair 2.  Creation of left Posterior Rectus-Transversalis Fascia Advancment Flap for Coverage of Pelvic Wound (200 cm) 3.  Robotic converted to Open recurrent right inguinal hernia repair.  Surgeon:  Aloysius Sheree Plant, MD  Anesthesia:  General endotracheal  Estimated Blood Loss:  20 ml  Specimens:  None  Complications:  None  Indications for Procedure:  This is a 53 y.o. male who presents with a left inguinal hernia and a recurrent right inguinal hernia.  The options of surgery versus observation were reviewed with the patient and/or family. The risks of bleeding, abscess or infection, recurrence of symptoms, potential for an open procedure, injury to surrounding structures, and chronic pain were all discussed with the patient and he was willing to proceed.  We have planned this transabdominal procedure with the creation of a left peritoneal flap based on the posterior rectus sheath and transversalis fascia in order to fully cover the mesh, creating a natural tisssue barrier for the bowel and peritoneal cavity.  Description of Procedure: The patient was correctly identified in the preoperative area and brought into the operating room.  The patient was placed supine with VTE prophylaxis in place.  Appropriate time-outs were performed.  Anesthesia was induced and the patient was intubated.  Foley catheter was placed.  Appropriate antibiotics were infused.  The abdomen was prepped and draped in a sterile fashion. A supraumbilical incision was made. A cutdown technique was used to enter the abdominal cavity without injury, and a Hasson trocar was inserted.  Pneumoperitoneum was obtained with appropriate opening pressures.  A Veress needle was used to  start dissecting the peritoneal flap.  Two 8-mm robotic ports were placed in the right and left lateral positions under direct visualization.  A large left Bard 3D Max Mesh, a 2-0 Vicryl, and 2-0 Stratafix suture were placed through the umbilical port under direct visualization.  The [employer] platform was docked onto the patient, the camera was inserted and targeted, and the instruments were placed under direct visualization.  Both inguinal regions were inspected for hernias and it was confirmed that the patient had bilateral inguinal hernias.  Using electocautery, the peritoneal and posterior rectus tissue flap was created.  The peritoneum on the left side was scored from the median umbilical ligament laterally towards the ASIS.  The flap was mobilized using robotic scissors and the bipolar instruments, creating a plane along the posterior rectus sheath and transversalis fascia down to the pubic tubercle medially. It was then further mobilized laterally across the inguinal canal and femoral vessels and onto the psoas muscle. The inferior epigastric vessels were identified and preserved. This created a posterior rectus and peritoneal flap measuring roughly 17 cm x 12 cm.  The patient had both direct and indirect hernias.  The hernia sac and contents were reduced preserving all structures.  A large left Bard 3D Max Mid mesh was placed with good overlap along all the potential hernia defects and secured in place with 2-0 Vicryl along the medial superomedial and superolateral aspects.  Then, the peritoneal flap was advanced over the mesh and carried over to close the defect. A running 2-0 Stratafix suture was used to approximate the edge of the flap onto the peritoneum.  I attempted a robotic recurrent right inguinal hernia repair as well.  I started  similarly to the left, starting the posterior rectus and peritoneal tissue flap using cautery.  As dissection was reaching the base of the epigastric vessels and the  internal inguinal right, there was too much scar tissue from the mesh to be able to safely continue.  It appeared the mesh may have displaced a bit or bunch up at least in the superior aspect of it.  But due to the amount of scar tissue, it was decided to convert this portion to open.  The peritoneal flap was closed using running 2-0 Stratafix suture.  All needles were removed under direct visualization.  The 8- mm ports were removed under direct visualization and the Hasson trocar was removed.  The fascial opening was closed using 0 vicryl suture.    An oblique incision was made between the right pubic symphysis extending laterally toward the right ASIS. Using electrocautery, the subcutaneous tissues were dissected, assuring adequate hemostasis, until reaching the external oblique aponeurosis.  A 1 cm incision was made over the aponeurosis and extended laterally and medially toward the external inguinal ring avoiding injury to the ilioinguinal nerve.  The cord was encircled using a Penrose drain and using medial and lateral retraction, the cord structures were identified and the hernia sac was identified and dissected free.  It was small and he also had a small cord lipoma.  This may be what could be palpated on exam in office.  The cord lipoma and sac were ligated and resected.  The stump was pushed back into the abdominal cavity.  A Bard mesh was then placed and sutured to the pubic tubercle, shelving edge, and conjoined tendon with interrupted 2-0 Prolene sutures.  The tails of the mesh were crossed behind the cord and sutured together creating a new internal ring.  The external oblique was then closed in running fashion with 2-0 Vicryl, creating a new external ring.  All inguinal nerves were visualized and preserved, away from suture lines.  Local anesthetic was infused in all incisions as well as a bilateral ilioinguinal block.  The wounds were irrigated and the right inguinal incision was closed in  three layers with 3-0 Vicryl and 4-0 Monocryl.  The umbilical incision was closed in two layers with 3-0 Vicryl and 4-0 Monocryl.  The other port sites were closed with 4-0 Monocryl.  The wounds were cleaned and sealed with DermaBond.  Foley catheter was removed and the patient was emerged from anesthesia and extubated and brought to the recovery room for further management.  The patient tolerated the procedure well and all counts were correct at the end of the case.   Aloysius Sheree Plant, MD

## 2024-07-17 NOTE — Anesthesia Preprocedure Evaluation (Addendum)
 Anesthesia Evaluation  Patient identified by MRN, date of birth, ID band Patient awake    Reviewed: Allergy & Precautions, H&P , NPO status , Patient's Chart, lab work & pertinent test results  Airway Mallampati: II  TM Distance: >3 FB     Dental   Pulmonary neg pulmonary ROS   Pulmonary exam normal        Cardiovascular negative cardio ROS  Rhythm:Regular Rate:Normal     Neuro/Psych negative neurological ROS  negative psych ROS   GI/Hepatic Neg liver ROS,GERD  ,,  Endo/Other  negative endocrine ROS    Renal/GU negative Renal ROS  negative genitourinary   Musculoskeletal negative musculoskeletal ROS (+)    Abdominal   Peds negative pediatric ROS (+)  Hematology negative hematology ROS (+)   Anesthesia Other Findings   Reproductive/Obstetrics negative OB ROS                              Anesthesia Physical Anesthesia Plan  ASA: 2  Anesthesia Plan: General   Post-op Pain Management:    Induction: Intravenous  PONV Risk Score and Plan:   Airway Management Planned: Oral ETT  Additional Equipment:   Intra-op Plan:   Post-operative Plan: Extubation in OR  Informed Consent: I have reviewed the patients History and Physical, chart, labs and discussed the procedure including the risks, benefits and alternatives for the proposed anesthesia with the patient or authorized representative who has indicated his/her understanding and acceptance.       Plan Discussed with: CRNA  Anesthesia Plan Comments:          Anesthesia Quick Evaluation

## 2024-07-17 NOTE — Transfer of Care (Signed)
 Immediate Anesthesia Transfer of Care Note  Patient: Philip Reynolds  Procedure(s) Performed: HERNIORRHAPHY, INGUINAL, ROBOT-ASSISTED, LAPAROSCOPIC (Left) REPAIR, HERNIA, INGUINAL, ADULT (Right)  Patient Location: PACU  Anesthesia Type:General  Level of Consciousness: awake, alert , and oriented  Airway & Oxygen Therapy: Patient Spontanous Breathing and Patient connected to face mask oxygen  Post-op Assessment: Report given to RN and Post -op Vital signs reviewed and stable  Post vital signs: Reviewed and stable  Last Vitals:  Vitals Value Taken Time  BP 113/77 07/17/24 16:18  Temp    Pulse 93 07/17/24 16:18  Resp 23 07/17/24 16:18  SpO2 100 % 07/17/24 16:18    Last Pain:  Vitals:   07/17/24 1205  TempSrc: Temporal  PainSc: 0-No pain         Complications: No notable events documented.

## 2024-07-17 NOTE — Discharge Instructions (Signed)
 Discharge Instructions: 1.  Patient may shower, but do not scrub wounds heavily and dab dry only. 2.  Do not submerge wounds in pool/tub until fully healed. 3.  Do not apply ointments or hydrogen peroxide to the wounds. 4.  May apply ice packs to the wounds for comfort. 5.  Do not drive while taking narcotics for pain control.  Prior to driving, make sure you are able to rotate right and left to look at blindspots without significant pain or discomfort. 6.  No heavy lifting or pushing of more than 10-15 lbs for 6 weeks. 7.  It is normal for there to be swelling or bruising at the groin and scrotum areas.  This will resolve on its own.

## 2024-07-17 NOTE — Anesthesia Postprocedure Evaluation (Signed)
 Anesthesia Post Note  Patient: RECARDO LINN  Procedure(s) Performed: HERNIORRHAPHY, INGUINAL, ROBOT-ASSISTED, LAPAROSCOPIC (Left) REPAIR, HERNIA, INGUINAL, ADULT (Right)  Patient location during evaluation: PACU Anesthesia Type: General Level of consciousness: awake and alert Pain management: pain level controlled Vital Signs Assessment: post-procedure vital signs reviewed and stable Respiratory status: spontaneous breathing, nonlabored ventilation, respiratory function stable and patient connected to nasal cannula oxygen Cardiovascular status: blood pressure returned to baseline and stable Postop Assessment: no apparent nausea or vomiting Anesthetic complications: no   No notable events documented.   Last Vitals:  Vitals:   07/17/24 1655 07/17/24 1700  BP:  (!) 132/94  Pulse: 88 85  Resp: 18 14  Temp:  (!) 36.2 C  SpO2: 98% 100%    Last Pain:  Vitals:   07/17/24 1700  TempSrc:   PainSc: 5                  Lendia LITTIE Mae

## 2024-07-17 NOTE — Anesthesia Procedure Notes (Signed)
 Procedure Name: Intubation Date/Time: 07/17/2024 1:08 PM  Performed by: Dominica Krabbe, CRNAPre-anesthesia Checklist: Patient identified, Emergency Drugs available, Suction available, Patient being monitored and Timeout performed Patient Re-evaluated:Patient Re-evaluated prior to induction Oxygen Delivery Method: Circle system utilized Preoxygenation: Pre-oxygenation with 100% oxygen Induction Type: IV induction Ventilation: Two handed mask ventilation required and Oral airway inserted - appropriate to patient size Laryngoscope Size: McGrath and 3 Grade View: Grade I Tube type: Oral Tube size: 7.5 mm Number of attempts: 1 Airway Equipment and Method: Stylet and Video-laryngoscopy Placement Confirmation: ETT inserted through vocal cords under direct vision, positive ETCO2 and breath sounds checked- equal and bilateral Secured at: 23 cm Tube secured with: Tape Dental Injury: Teeth and Oropharynx as per pre-operative assessment

## 2024-07-17 NOTE — Interval H&P Note (Signed)
 History and Physical Interval Note:  07/17/2024 12:28 PM  Philip Reynolds  has presented today for surgery, with the diagnosis of left inguinal hernia, possible recurrent right inguinal hernia.  The various methods of treatment have been discussed with the patient and family. After consideration of risks, benefits and other options for treatment, the patient has consented to  Procedure(s) with comments: HERNIORRHAPHY, INGUINAL, ROBOT-ASSISTED, LAPAROSCOPIC (Left) - Possible bilateral repair, possible open REPAIR, HERNIA, INGUINAL, BILATERAL, ADULT (Left) - POSSIBLE OPEN as a surgical intervention.  The patient's history has been reviewed, patient examined, no change in status, stable for surgery.  I have reviewed the patient's chart and labs.  Questions were answered to the patient's satisfaction.     Aayliah Rotenberry

## 2024-07-18 ENCOUNTER — Encounter: Payer: Self-pay | Admitting: Surgery

## 2024-07-31 ENCOUNTER — Encounter: Payer: Self-pay | Admitting: Surgery

## 2024-07-31 ENCOUNTER — Ambulatory Visit: Admitting: Surgery

## 2024-07-31 ENCOUNTER — Encounter: Admitting: Physician Assistant

## 2024-07-31 VITALS — BP 130/81 | HR 76 | Temp 98.1°F | Ht 71.25 in | Wt 210.2 lb

## 2024-07-31 DIAGNOSIS — K4091 Unilateral inguinal hernia, without obstruction or gangrene, recurrent: Secondary | ICD-10-CM

## 2024-07-31 DIAGNOSIS — K4090 Unilateral inguinal hernia, without obstruction or gangrene, not specified as recurrent: Secondary | ICD-10-CM

## 2024-07-31 DIAGNOSIS — Z09 Encounter for follow-up examination after completed treatment for conditions other than malignant neoplasm: Secondary | ICD-10-CM

## 2024-07-31 MED ORDER — OXYCODONE HCL 5 MG PO TABS: 5.0000 mg | ORAL_TABLET | ORAL | 0 refills | Status: AC | PRN

## 2024-07-31 NOTE — Progress Notes (Signed)
 07/31/2024  HPI: Philip Reynolds is a 53 y.o. male s/p robotic assisted left inguinal hernia repair and open recurrent right inguinal hernia repair on 07/17/2024.  Patient presents today for follow-up.  He reports that he has been doing well and reports soreness in the right groin with area of swelling in that area.  Denies any troubles with the left groin.  Vital signs: BP 130/81   Pulse 76   Temp 98.1 F (36.7 C) (Oral)   Ht 5' 11.25 (1.81 m)   Wt 210 lb 3.2 oz (95.3 kg)   SpO2 97%   BMI 29.11 kg/m    Physical Exam: Constitutional: No acute distress Abdomen: Soft, nondistended, appropriate tender to palpation.  Incisions are healing well and are clean, dry, intact.  The right groin incision has expected swelling and firmness consistent with scarring and healing.  There is no evidence of hernia recurrence on either side.  Assessment/Plan: This is a 53 y.o. male s/p robotic assisted left inguinal hernia repair and open recurrent right inguinal hernia repair.  - Discussed with patient that overall he is healing appropriately and is not uncommon to have swelling and firmness in the right groin at the area of the open repair.  This will continue to improve and flatten down as the inflammation comes down and scar tissue improves.  He could apply vitamin E lotion to the incisions to also allow them to fade more. - Discussed with him activity restrictions. - Return precautions given. - Follow-up as needed   Aloysius Sheree Plant, MD [facility]

## 2024-07-31 NOTE — Patient Instructions (Signed)

## 2025-06-17 ENCOUNTER — Ambulatory Visit: Admitting: Dermatology
